# Patient Record
Sex: Female | Born: 1940 | Race: White | Hispanic: No | Marital: Married | Smoking: Former smoker
Health system: Southern US, Community
[De-identification: ages and names within clinical notes are randomized; demographics above are authoritative.]

## PROBLEM LIST (undated history)

## (undated) DIAGNOSIS — N281 Cyst of kidney, acquired: Secondary | ICD-10-CM

## (undated) DIAGNOSIS — M1990 Unspecified osteoarthritis, unspecified site: Secondary | ICD-10-CM

## (undated) DIAGNOSIS — K5790 Diverticulosis of intestine, part unspecified, without perforation or abscess without bleeding: Secondary | ICD-10-CM

## (undated) DIAGNOSIS — M8580 Other specified disorders of bone density and structure, unspecified site: Secondary | ICD-10-CM

## (undated) DIAGNOSIS — L719 Rosacea, unspecified: Secondary | ICD-10-CM

## (undated) DIAGNOSIS — I7143 Infrarenal abdominal aortic aneurysm, without rupture: Secondary | ICD-10-CM

## (undated) DIAGNOSIS — K226 Gastro-esophageal laceration-hemorrhage syndrome: Secondary | ICD-10-CM

## (undated) DIAGNOSIS — I10 Essential (primary) hypertension: Secondary | ICD-10-CM

## (undated) DIAGNOSIS — I714 Abdominal aortic aneurysm, without rupture: Secondary | ICD-10-CM

## (undated) DIAGNOSIS — D649 Anemia, unspecified: Secondary | ICD-10-CM

## (undated) DIAGNOSIS — K7460 Unspecified cirrhosis of liver: Secondary | ICD-10-CM

## (undated) DIAGNOSIS — I700 Atherosclerosis of aorta: Secondary | ICD-10-CM

## (undated) DIAGNOSIS — I2510 Atherosclerotic heart disease of native coronary artery without angina pectoris: Secondary | ICD-10-CM

## (undated) DIAGNOSIS — E785 Hyperlipidemia, unspecified: Secondary | ICD-10-CM

## (undated) DIAGNOSIS — I712 Thoracic aortic aneurysm, without rupture: Secondary | ICD-10-CM

## (undated) HISTORY — PX: ABDOMINAL HYSTERECTOMY: SHX81

## (undated) HISTORY — DX: Diverticulosis of intestine, part unspecified, without perforation or abscess without bleeding: K57.90

## (undated) HISTORY — DX: Abdominal aortic aneurysm, without rupture: I71.4

## (undated) HISTORY — DX: Atherosclerosis of aorta: I70.0

## (undated) HISTORY — DX: Gastro-esophageal laceration-hemorrhage syndrome: K22.6

## (undated) HISTORY — DX: Infrarenal abdominal aortic aneurysm, without rupture: I71.43

## (undated) HISTORY — DX: Atherosclerotic heart disease of native coronary artery without angina pectoris: I25.10

## (undated) HISTORY — DX: Rosacea, unspecified: L71.9

## (undated) HISTORY — DX: Unspecified osteoarthritis, unspecified site: M19.90

## (undated) HISTORY — DX: Cyst of kidney, acquired: N28.1

## (undated) HISTORY — DX: Hyperlipidemia, unspecified: E78.5

## (undated) HISTORY — PX: TONSILLECTOMY AND ADENOIDECTOMY: SUR1326

## (undated) HISTORY — DX: Anemia, unspecified: D64.9

## (undated) HISTORY — DX: Unspecified cirrhosis of liver: K74.60

## (undated) HISTORY — DX: Other specified disorders of bone density and structure, unspecified site: M85.80

## (undated) HISTORY — DX: Essential (primary) hypertension: I10

---
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
## 2018-10-30 DIAGNOSIS — I712 Thoracic aortic aneurysm, without rupture, unspecified: Secondary | ICD-10-CM

## 2018-10-30 HISTORY — DX: Thoracic aortic aneurysm, without rupture, unspecified: I71.20

## 2018-10-30 HISTORY — DX: Thoracic aortic aneurysm, without rupture: I71.2

## 2018-12-08 ENCOUNTER — Encounter: Payer: Medicare Other | Admitting: Vascular Surgery

## 2018-12-08 ENCOUNTER — Encounter: Payer: Self-pay | Admitting: Vascular Surgery

## 2019-01-12 ENCOUNTER — Ambulatory Visit (INDEPENDENT_AMBULATORY_CARE_PROVIDER_SITE_OTHER): Payer: Medicare Other | Admitting: Vascular Surgery

## 2019-01-12 ENCOUNTER — Encounter: Payer: Self-pay | Admitting: Vascular Surgery

## 2019-01-12 ENCOUNTER — Other Ambulatory Visit: Payer: Self-pay

## 2019-01-12 VITALS — BP 137/75 | HR 88 | Temp 97.4°F | Resp 24 | Ht 59.0 in | Wt 164.8 lb

## 2019-01-12 DIAGNOSIS — I714 Abdominal aortic aneurysm, without rupture, unspecified: Secondary | ICD-10-CM

## 2019-01-12 DIAGNOSIS — I712 Thoracic aortic aneurysm, without rupture, unspecified: Secondary | ICD-10-CM

## 2019-01-12 NOTE — Progress Notes (Signed)
Referring Physician: Charlott Rakes  Patient name: Kathleen Morton MRN: [id number] DOB: [birth year] Sex: female  REASON FOR CONSULT: Thoracic and abdominal aortic aneurysm  HPI: Kathleen Morton is a 78 y.o. 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  Eventual work-up showed an esophageal tear that caused her bleeding.  This has now healed.  She has no family history of abdominal aortic aneurysm.  She has no abdominal or back pain.  She has fairly significant pulmonary dysfunction and become short of breath while after climbing only a couple of stairs.  She did quit smoking about 10 years ago.  She gets short of breath with minimal exertion.  She is currently on aspirin and a statin.  She does not really describe claudication symptoms.  She has no rest pain in her feet.  She has never had a myocardial infarction or stroke.  Other medical problems include hyperlipidemia, Mallory-Weiss tear as mentioned above, cirrhosis, diabetes.  All of these are currently stable.  Past Medical History:  Diagnosis Date  . Anemia   . Aneurysm of infrarenal abdominal aorta (HCC)    Fusiform.  4.6cm  . Aortic atherosclerosis (HCC)   . Cirrhosis of liver (HCC)   . Coronary artery calcification   . Diverticulosis    Descending and Sigmoid  . DJD (degenerative joint disease)    Mild facet in lower lumbar spine.  . Hyperlipidemia   . Hypertension   . Mallory-Weiss tear   . Osteopenia   . Renal cyst    3.2 cm  . Rosacea   . Thoracic aortic aneurysm (HCC) 10/30/2018   Distal 3.7cm   Past Surgical History:  Procedure Laterality Date  . ABDOMINAL HYSTERECTOMY    . TONSILLECTOMY AND ADENOIDECTOMY      Family History  Problem Relation Age of Onset  . Hypertension Mother   . Hypertension Father   . Heart disease Father   . Cancer Brother     SOCIAL HISTORY: Social History   Socioeconomic History  . Marital status: Married   Spouse name: Not on file  . Number of children: Not on file  . Years of education: Not on file  . Highest education level: Not on file  Occupational History  . Not on file  Social Needs  . Financial resource strain: Not on file  . Food insecurity    Worry: Not on file    Inability: Not on file  . Transportation needs    Medical: Not on file    Non-medical: Not on file  Tobacco Use  . Smoking status: Former Games developer  . Smokeless tobacco: Never Used  Substance and Sexual Activity  . Alcohol use: Yes    Comment: MInimal  . Drug use: Never  . Sexual activity: Not on file  Lifestyle  . Physical activity    Days per week: Not on file    Minutes per session: Not on file  . Stress: Not on file  Relationships  . Social Musician on phone: Not on file    Gets together: Not on file    Attends religious service: Not on file    Active member of club or organization: Not on file    Attends meetings of clubs or organizations: Not on file    Relationship status: Not on file  . Intimate partner violence    Fear of current or ex partner: Not on file  Emotionally abused: Not on file    Physically abused: Not on file    Forced sexual activity: Not on file  Other Topics Concern  . Not on file  Social History Narrative  . Not on file    Allergies  Allergen Reactions  . Diphenhydramine   . Meperidine And Related Nausea And Vomiting  . Tetanus Toxoids     And Vaccines    Current Outpatient Medications  Medication Sig Dispense Refill  . aspirin 81 MG chewable tablet Chew by mouth daily.    . Cholecalciferol (D3 ADULT PO) Take by mouth.    . DOXYLAMINE SUCCINATE, SLEEP, PO Take by mouth.    . losartan (COZAAR) 25 MG tablet Take 25 mg by mouth every evening.    . meclizine (ANTIVERT) 25 MG tablet TAKE 1 TABLET EVERY 6 HOURS AS NEEDED FOR DIZZINESS; TAKE 1/2 TO 1 TABLET AS NEEDED FOR VERTIGO SYMPTOMS.    . metFORMIN (GLUCOPHAGE) 500 MG tablet     . Multiple Vitamin  (MULTIVITAMIN) tablet Take 1 tablet by mouth daily.    . Multiple Vitamins-Minerals (OCUVITE PO) Take by mouth.    . pioglitazone (ACTOS) 30 MG tablet Take 15 mg by mouth daily.    . pravastatin (PRAVACHOL) 20 MG tablet Take 20 mg by mouth once a week.     No current facility-administered medications for this visit.     ROS:   General:  No weight loss, Fever, chills  HEENT: No recent headaches, no nasal bleeding, no visual changes, no sore throat  Neurologic: No dizziness, blackouts, seizures. No recent symptoms of stroke or mini- stroke. No recent episodes of slurred speech, or temporary blindness.  Cardiac: No recent episodes of chest pain/pressure, no shortness of breath at rest.  No shortness of breath with exertion.  Denies history of atrial fibrillation or irregular heartbeat  Vascular: No history of rest pain in feet.  No history of claudication.  No history of non-healing ulcer, No history of DVT   Pulmonary: No home oxygen, no productive cough, no hemoptysis,  No asthma or wheezing  Musculoskeletal:  [ ]  Arthritis, [ ]  Low back pain,  [ ]  Joint pain  Hematologic:No history of hypercoagulable state.  No history of easy bleeding.  No history of anemia  Gastrointestinal: No hematochezia or melena,  No gastroesophageal reflux, no trouble swallowing  Urinary: [ ]  chronic Kidney disease, [ ]  on HD - [ ]  MWF or [ ]  TTHS, [ ]  Burning with urination, [ ]  Frequent urination, [ ]  Difficulty urinating;   Skin: No rashes  Psychological: No history of anxiety,  No history of depression   Physical Examination  Vitals:   01/12/19 1401  BP: 137/75  Pulse: 88  Resp: (!) 24  Temp: (!) 97.4 F (36.3 C)  SpO2: 94%  Weight: 164 lb 12.8 oz (74.8 kg)  Height: 4\' 11"  (1.499 m)    Body mass index is 33.29 kg/m.  General:  Alert and oriented, no acute distress HEENT: Normal Neck: No JVD Pulmonary: Clear to auscultation bilaterally Cardiac: Regular Rate and Rhythm  Abdomen:  Soft, non-tender, non-distended, no mass, obese Skin: No rash Extremity Pulses:  2+ radial, brachial, femoral, absent popliteal dorsalis pedis, posterior tibial pulses bilaterally Musculoskeletal: No deformity or edema  Neurologic: Upper and lower extremity motor 5/5 and symmetric  DATA:  I reviewed the images from the patient's CT scan of the chest abdomen and [facility] 30, [facility].  This shows a  3.7 cm descending thoracic aneurysm and a 4.6 cm infrarenal abdominal aortic aneurysm.  There is a normal intervening segment of aorta in the visceral renal segment.  The iliacs were not fully imaged due to the nature of the CT scan.  ASSESSMENT: #1 infrarenal 4.6 cm abdominal aortic aneurysm.  Discussed with the patient today pathophysiology of aneurysms and risk of rupture.  I discussed with her that we would consider repair at 5 to 5-1/2 cm in diameter.  2.  3.7 cm descending thoracic aneurysm.  I discussed with the patient that we would consider repair at 6 cm in diameter.   PLAN: #1 the patient will be scheduled for aortic ultrasound in 6 months time.  With her body habitus this may be difficult to do.  If things are not well visualized into the iliac segments in the abdominal aorta she will need a CT Angio of the abdomen and pelvis.  #2 as far as her thoracic aneurysm is concerned this is fairly small.  Rather than imaging her frequently I would suggest that we reimage her chest in about 2 to 3 years.  The patient will return to our [facility] clinic with the above tests in 6 months.   Ruta Hinds, MD [facility] [place] Office: [phone] Pager: [phone]

## 2019-03-25 DIAGNOSIS — K922 Gastrointestinal hemorrhage, unspecified: Secondary | ICD-10-CM | POA: Diagnosis not present

## 2019-03-25 DIAGNOSIS — E1169 Type 2 diabetes mellitus with other specified complication: Secondary | ICD-10-CM

## 2019-03-25 DIAGNOSIS — I10 Essential (primary) hypertension: Secondary | ICD-10-CM | POA: Diagnosis not present

## 2019-03-25 DIAGNOSIS — I714 Abdominal aortic aneurysm, without rupture: Secondary | ICD-10-CM

## 2019-03-26 DIAGNOSIS — I10 Essential (primary) hypertension: Secondary | ICD-10-CM | POA: Diagnosis not present

## 2019-03-26 DIAGNOSIS — K922 Gastrointestinal hemorrhage, unspecified: Secondary | ICD-10-CM | POA: Diagnosis not present

## 2019-03-26 DIAGNOSIS — I714 Abdominal aortic aneurysm, without rupture: Secondary | ICD-10-CM | POA: Diagnosis not present

## 2019-03-26 DIAGNOSIS — E1169 Type 2 diabetes mellitus with other specified complication: Secondary | ICD-10-CM | POA: Diagnosis not present

## 2019-08-28 ENCOUNTER — Other Ambulatory Visit: Payer: Self-pay

## 2019-08-28 DIAGNOSIS — I714 Abdominal aortic aneurysm, without rupture, unspecified: Secondary | ICD-10-CM

## 2019-09-18 ENCOUNTER — Other Ambulatory Visit: Payer: Self-pay

## 2019-09-18 ENCOUNTER — Ambulatory Visit
Admission: RE | Admit: 2019-09-18 | Discharge: 2019-09-18 | Disposition: A | Discharge status: Home / Self Care | Payer: Medicare Other | Source: Ambulatory Visit | Attending: Vascular Surgery | Admitting: Vascular Surgery

## 2019-09-18 DIAGNOSIS — I714 Abdominal aortic aneurysm, without rupture, unspecified: Secondary | ICD-10-CM

## 2019-09-18 MED ORDER — IOPAMIDOL (ISOVUE-370) INJECTION 76%
75.0000 mL | Freq: Once | INTRAVENOUS | Status: AC | PRN
  Administered 2019-09-18: 75 mL via INTRAVENOUS

## 2019-09-21 ENCOUNTER — Ambulatory Visit: Payer: Medicare Other | Admitting: Vascular Surgery

## 2019-10-26 ENCOUNTER — Ambulatory Visit (INDEPENDENT_AMBULATORY_CARE_PROVIDER_SITE_OTHER): Payer: Medicare Other | Admitting: Vascular Surgery

## 2019-10-26 ENCOUNTER — Encounter: Payer: Self-pay | Admitting: Vascular Surgery

## 2019-10-26 ENCOUNTER — Other Ambulatory Visit: Payer: Self-pay

## 2019-10-26 VITALS — BP 110/61 | HR 97 | Temp 98.0°F | Resp 20 | Wt 168.0 lb

## 2019-10-26 DIAGNOSIS — I714 Abdominal aortic aneurysm, without rupture, unspecified: Secondary | ICD-10-CM

## 2019-10-26 DIAGNOSIS — I739 Peripheral vascular disease, unspecified: Secondary | ICD-10-CM | POA: Diagnosis not present

## 2019-10-26 NOTE — Progress Notes (Signed)
Patient name: Kathleen Morton MRN: [id number] DOB: 1940-07-02 Sex: female  HPI: Kathleen Morton is a 79 y.o. female, with known abdominal aortic and thoracic aortic aneurysm.  She was last seen October 2020.  At that time she had a 4.6 cm infrarenal abdominal aortic aneurysm as well as a 3.7 cm descending thoracic aneurysm.  Other medical problems include vertigo.  He also has a history of cirrhosis hyperlipidemia hypertension all of which are currently stable.  Past Medical History:  Diagnosis Date  . Anemia   . Aneurysm of infrarenal abdominal aorta (HCC)    Fusiform.  4.6cm  . Aortic atherosclerosis (HCC)   . Cirrhosis of liver (HCC)   . Coronary artery calcification   . Diverticulosis    Descending and Sigmoid  . DJD (degenerative joint disease)    Mild facet in lower lumbar spine.  . Hyperlipidemia   . Hypertension   . Mallory-Weiss tear   . Osteopenia   . Renal cyst    3.2 cm  . Rosacea   . Thoracic aortic aneurysm (HCC) 10/30/2018   Distal 3.7cm   Past Surgical History:  Procedure Laterality Date  . ABDOMINAL HYSTERECTOMY    . TONSILLECTOMY AND ADENOIDECTOMY      Family History  Problem Relation Age of Onset  . Hypertension Mother   . Hypertension Father   . Heart disease Father   . Cancer Brother     SOCIAL HISTORY: Social History   Socioeconomic History  . Marital status: Married    Spouse name: Not on file  . Number of children: Not on file  . Years of education: Not on file  . Highest education level: Not on file  Occupational History  . Not on file  Tobacco Use  . Smoking status: Former Games developer  . Smokeless tobacco: Never Used  Vaping Use  . Vaping Use: Never used  Substance and Sexual Activity  . Alcohol use: Yes    Comment: MInimal  . Drug use: Never  . Sexual activity: Not on file  Other Topics Concern  . Not on file  Social History Narrative  . Not on file   Social Determinants of Health   Financial Resource Strain:   .  Difficulty of Paying Living Expenses:   Food Insecurity:   . Worried About Programme researcher, broadcasting/film/video in the Last Year:   . Barista in the Last Year:   Transportation Needs:   . Freight forwarder (Medical):   [name] Lack of Transportation (Non-Medical):   Physical Activity:   . Days of Exercise per Week:   . Minutes of Exercise per Session:   Stress:   . Feeling of Stress :   Social Connections:   . Frequency of Communication with Friends and Family:   . Frequency of Social Gatherings with Friends and Family:   . Attends Religious Services:   . Active Member of Clubs or Organizations:   . Attends Banker Meetings:   [name] Marital Status:   Intimate Partner Violence:   . Fear of Current or Ex-Partner:   . Emotionally Abused:   [name] Physically Abused:   . Sexually Abused:     Allergies  Allergen Reactions  . Meperidine And Related Nausea And Vomiting  . Diphenhydramine   . Tetanus Toxoids     And Vaccines    Current Outpatient Medications  Medication Sig Dispense Refill  . aspirin 81 MG chewable tablet Chew by mouth daily.    [name]  Cholecalciferol (D3 ADULT PO) Take by mouth.    . DOXYLAMINE SUCCINATE, SLEEP, PO Take by mouth.    . losartan (COZAAR) 25 MG tablet Take 25 mg by mouth every evening.    . meclizine (ANTIVERT) 25 MG tablet TAKE 1 TABLET EVERY 6 HOURS AS NEEDED FOR DIZZINESS; TAKE 1/2 TO 1 TABLET AS NEEDED FOR VERTIGO SYMPTOMS.    . metFORMIN (GLUCOPHAGE) 500 MG tablet     . Multiple Vitamin (MULTIVITAMIN) tablet Take 1 tablet by mouth daily.    . Multiple Vitamins-Minerals (OCUVITE PO) Take by mouth.    . pioglitazone (ACTOS) 30 MG tablet Take 15 mg by mouth daily.    . pravastatin (PRAVACHOL) 20 MG tablet Take 20 mg by mouth once a week.     No current facility-administered medications for this visit.    ROS:   She has no chest pain.  She develops shortness of breath after walking about a half a block.  Physical Examination  Vitals:   10/26/19  1359  BP: 110/61  Pulse: 97  Resp: 20  Temp: 98 F (36.7 C)  SpO2: 92%  Weight: 168 lb (76.2 kg)     General:  Alert and oriented, no acute distress HEENT: Normal Neck: No JVD Cardiac: Regular Rate and Rhythm Abdomen: Soft, non-tender, non-distended, no mass, obese  skin: No rash Extremity Pulses: Absent femoral popliteal pedal pulses bilaterally feet pink and warm Musculoskeletal: No deformity or edema  Neurologic: Upper and lower extremity motor 5/5 and symmetric  DATA:  She had a CT scan of the abdomen and pelvis on 09/18/2019.  I reviewed this images today.  This shows a 4.6 cm infrarenal abdominal aortic aneurysm.  There was also stenosis at the aortic bifurcation.  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  There was a high-grade stenosis of the right common iliac artery and left common iliac artery.  Of note there were diffuse nodular changes of the liver consistent with cirrhosis.   ASSESSMENT: Essentially unchanged 4.6 cm infrarenal abdominal aortic aneurysm.  There is severe neck angulation and occlusive disease which probably would preclude a aneurysm stent graft repair.  Patient is severely obese and with her cirrhosis underlying poor pulmonary function and body habitus she would be a very high risk operation probably with mortality exceeding 5%.  This is certainly higher than her risk of rupture of her aneurysm currently.   PLAN: Patient will follow up in 1 year with repeat ultrasound of the abdominal aorta.  Gust with the patient that we may not be able to see her aortic diameter due to her body habitus but if the ultrasound is unsuccessful we will schedule her for CT scan.  She was agreeable with this.  Consideration for repair if diameter reaches greater than 5-1/2 to 6 cm.  Although she has some narrowing of the distal abdominal aorta I believe any procedure in her is going to be high risk due to her multiple comorbidities.  She does have some buttock claudication at  about 50 feet in both sides.  However I do not believe the risk of operation is worth it for her mild to moderate symptoms.  Her thoracic aortic aneurysm was fairly small on last CT scan we will wait a few years before rescanning this.   Fabienne Bruns, MD [facility] [place] Office: [phone]

## 2020-12-19 ENCOUNTER — Other Ambulatory Visit: Payer: Self-pay

## 2020-12-19 DIAGNOSIS — I714 Abdominal aortic aneurysm, without rupture, unspecified: Secondary | ICD-10-CM

## 2020-12-22 NOTE — Progress Notes (Signed)
[facility] [place]  ASSESSMENT / PLAN: Kathleen Morton is a 80 y.o. female with an infrarenal abdominal aortic aneurysm measuring 81mm. She has cirrhosis with portal hypertension.  She has chronic obstructive pulmonary disease.  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  She is not a candidate for open reconstruction.  We should wait until she is past the threshold before considering prophylactic endovascular aneurysm repair. Angioplasty with lithotripsy may be a good option to try to create a channel for EVAR sheaths when she needs repair.  Complete cessation from all tobacco products. Excellent blood glucose control with goal A1c < 7%. Blood pressure control with goal blood pressure < 140/90 mmHg. Excellent lipid reduction therapy with goal LDL-C <100 mg/dL. Aspirin 81mg  PO QD.  Atorvastatin 40-80mg  PO QD (or other "high intensity" statin therapy)  Follow up with me in 6 months with AAA duplex  CHIEF COMPLAINT: AAA surveillance  HISTORY OF PRESENT ILLNESS: Kathleen Morton is a 80 y.o. female deviously seen by Dr. 77 for surveillance of infrarenal abdominal aortic aneurysm.  The patient is asymptomatic in regards to her aneurysm.  We had a lengthy discussion about the natural history of aneurysm disease and the rationale for treatment.  Explained her low risk of rupture.  Past Medical History:  Diagnosis Date   Anemia    Aneurysm of infrarenal abdominal aorta (HCC)    Fusiform.  4.6cm   Aortic atherosclerosis (HCC)    Cirrhosis of liver (HCC)    Coronary artery calcification    Diverticulosis    Descending and Sigmoid   DJD (degenerative joint disease)    Mild facet in lower lumbar spine.   Hyperlipidemia    Hypertension    Mallory-Weiss tear    Osteopenia    Renal cyst    3.2 cm   Rosacea    Thoracic aortic aneurysm (HCC) 10/30/2018   Distal 3.7cm    Past Surgical History:  Procedure Laterality  Date   ABDOMINAL HYSTERECTOMY     TONSILLECTOMY AND ADENOIDECTOMY      Family History  Problem Relation Age of Onset   Hypertension Mother    Hypertension Father    Heart disease Father    Cancer Brother     Social History   Socioeconomic History   Marital status: Married    Spouse name: Not on file   Number of children: Not on file   Years of education: Not on file   Highest education level: Not on file  Occupational History   Not on file  Tobacco Use   Smoking status: Former   Smokeless tobacco: Never  Vaping Use   Vaping Use: Never used  Substance and Sexual Activity   Alcohol use: Yes    Comment: MInimal   Drug use: Never   Sexual activity: Not on file  Other Topics Concern   Not on file  Social History Narrative   Not on file   Social Determinants of Health   Financial Resource Strain: Not on file  Food Insecurity: Not on file  Transportation Needs: Not on file  Physical Activity: Not on file  Stress: Not on file  Social Connections: Not on file  Intimate Partner Violence: Not on file    Allergies  Allergen Reactions   Meperidine And Related Nausea And Vomiting   Diphenhydramine    Tetanus Toxoids     And Vaccines    Current Outpatient Medications  Medication Sig Dispense Refill   aspirin  81 MG chewable tablet Chew by mouth daily.     Cholecalciferol (D3 ADULT PO) Take by mouth.     DOXYLAMINE SUCCINATE, SLEEP, PO Take by mouth.     losartan (COZAAR) 25 MG tablet Take 25 mg by mouth every evening.     meclizine (ANTIVERT) 25 MG tablet TAKE 1 TABLET EVERY 6 HOURS AS NEEDED FOR DIZZINESS; TAKE 1/2 TO 1 TABLET AS NEEDED FOR VERTIGO SYMPTOMS.     Multiple Vitamin (MULTIVITAMIN) tablet Take 1 tablet by mouth daily.     Multiple Vitamins-Minerals (OCUVITE PO) Take by mouth.     pioglitazone (ACTOS) 30 MG tablet Take 15 mg by mouth daily.     pravastatin (PRAVACHOL) 20 MG tablet Take 20 mg by mouth once a week.     metFORMIN (GLUCOPHAGE) 500 MG tablet   (Patient not taking: Reported on 12/24/2020)     No current facility-administered medications for this visit.    REVIEW OF SYSTEMS:  [X]  denotes positive finding, [ ]  denotes negative finding Cardiac  Comments:  Chest pain or chest pressure:    Shortness of breath upon exertion:    Short of breath when lying flat:    Irregular heart rhythm:        Vascular    Pain in calf, thigh, or hip brought on by ambulation:    Pain in feet at night that wakes you up from your sleep:     Blood clot in your veins:    Leg swelling:         Pulmonary    Oxygen at home:    Productive cough:     Wheezing:         Neurologic    Sudden weakness in arms or legs:     Sudden numbness in arms or legs:     Sudden onset of difficulty speaking or slurred speech:    Temporary loss of vision in one eye:     Problems with dizziness:         Gastrointestinal    Blood in stool:     Vomited blood:         Genitourinary    Burning when urinating:     Blood in urine:        Psychiatric    Major depression:         Hematologic    Bleeding problems:    Problems with blood clotting too easily:        Skin    Rashes or ulcers:        Constitutional    Fever or chills:      PHYSICAL EXAM Vitals:   12/24/20 0935  BP: 137/84  Pulse: 87  Resp: 20  Temp: 98.2 F (36.8 C)  SpO2: 93%  Weight: 167 lb (75.8 kg)  Height: 4\' 11"  (1.499 m)    Constitutional: well appearing. no distress. Appears well nourished.  Neurologic: CN intact. no focal findings. no sensory loss. Psychiatric:  Mood and affect symmetric and appropriate. Eyes:  No icterus. No conjunctival pallor. Ears, nose, throat:  mucous membranes moist. Midline trachea.  Cardiac: regular rate and rhythm.  Respiratory:  unlabored. Abdominal:  soft, non-tender, non-distended.  Peripheral vascular: no palpable pedal pulses. Warm feet.  Extremity: no edema. no cyanosis. no pallor.  Skin: no gangrene. no ulceration.  Lymphatic: no  Stemmer's sign. no palpable lymphadenopathy.  PERTINENT LABORATORY AND RADIOLOGIC DATA ABDOMINAL AORTA STUDY   Patient Name:  Kathleen Morton  Date of  Exam:   12/24/2020  Medical Rec #: [id number]           Accession #:    [id number]  Date of Birth: Mar 07, 1941           Patient Gender: F  Patient Age:   4 years  Exam Location:  [facility]  Procedure:      VAS [place] AAA DUPLEX  Referring Phys: Heath Lark    ---------------------------------------------------------------------------  -----     Indications: Follow up exam for known AAA.   Limitations: Air/bowel gas, obesity, patient discomfort and Patient was  not NPO.      Performing Technologist: Hardie Lora RVT      Examination Guidelines: A complete evaluation includes B-mode imaging,  spectral  Doppler, color Doppler, and power Doppler as needed of all accessible  portions  of each vessel. Bilateral testing is considered an integral part of a  complete  examination. Limited examinations for reoccurring indications may be  performed  as noted.      Abdominal Aorta Findings:  +-------------+-------+----------+----------+--------+--------+--------+  Location     AP (cm)Trans (cm)PSV (cm/s)WaveformThrombusComments  +-------------+-------+----------+----------+--------+--------+--------+  Proximal     2.43   2.51      72                                  +-------------+-------+----------+----------+--------+--------+--------+  Mid          4.60   4.78      166                                 +-------------+-------+----------+----------+--------+--------+--------+  RT EIA Distal0.6    0.6       92                                  +-------------+-------+----------+----------+--------+--------+--------+  LT EIA Distal0.6    0.6       145                                 +-------------+-------+----------+----------+--------+--------+--------+   Unable to visualize  distal AA secondary to bowel gas from patient not  being NPO.        Summary:  Abdominal Aorta: There is evidence of abnormal dilatation of the mid  Abdominal aorta. The largest aortic measurement is 4.8 cm. The largest  aortic diameter remains essentially unchanged compared to prior exam.  Previous diameter measurement was 4.6 cm  obtained on 09/18/2019 CT.     *See table(s) above for measurements and observations.     Electronically signed by Heath Lark on 12/24/2020 at 12:34:16 PM.    Rande Brunt. Lenell Antu, MD [facility] Phone Number: [phone] 12/24/2020 12:56 PM  Total time spent on preparing this encounter including chart review, data review, collecting history, examining the patient, coordinating care for this established patient, 40 minutes.  Portions of this report may have been transcribed using voice recognition software.  Every effort has been made to ensure accuracy; however, inadvertent computerized transcription errors may still be present.

## 2020-12-24 ENCOUNTER — Other Ambulatory Visit: Payer: Self-pay

## 2020-12-24 ENCOUNTER — Ambulatory Visit (HOSPITAL_COMMUNITY)
Admission: RE | Admit: 2020-12-24 | Discharge: 2020-12-24 | Disposition: A | Discharge status: Home / Self Care | Payer: Medicare Other | Source: Ambulatory Visit | Attending: Vascular Surgery | Admitting: Vascular Surgery

## 2020-12-24 ENCOUNTER — Ambulatory Visit (INDEPENDENT_AMBULATORY_CARE_PROVIDER_SITE_OTHER): Payer: Medicare Other | Admitting: Vascular Surgery

## 2020-12-24 ENCOUNTER — Encounter: Payer: Self-pay | Admitting: Vascular Surgery

## 2020-12-24 VITALS — BP 137/84 | HR 87 | Temp 98.2°F | Resp 20 | Ht 59.0 in | Wt 167.0 lb

## 2020-12-24 DIAGNOSIS — I714 Abdominal aortic aneurysm, without rupture, unspecified: Secondary | ICD-10-CM

## 2020-12-26 ENCOUNTER — Other Ambulatory Visit: Payer: Self-pay

## 2020-12-26 DIAGNOSIS — I714 Abdominal aortic aneurysm, without rupture, unspecified: Secondary | ICD-10-CM

## 2021-07-01 NOTE — Progress Notes (Signed)
?HISTORY AND PHYSICAL  ? ? ? ?CC:  follow up. ?Requesting Provider:  Marco Collie, MD ? ?HPI: This is a 81 y.o. female who is here today for follow up for AAA.  She has an infrarenal AAA.  She has hx of portal HTN and COPD.  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  She was deemed not a candidate for open reconstruction and felt waiting until she is past the threshold before considering prophylactic endovascular aneurysm repair.  It was felt that angioplasty with lithotripsy may be a good option to try to create a channel for EVAR sheaths when she needs repair.   ? ?Pt was last seen 12/24/2020 and at that time, she was asymptomatic.  ? ?The pt returns today for follow up.  She states she is doing well.  She has some back pain but this is not new.  She denies any severe abdominal or back pain.  She does not have claudication or rest pain or non healing wounds.  ? ?The pt is on a statin for cholesterol management.    ?The pt is on an aspirin.    Other AC:  none ?The pt is on ARB for hypertension.  ?The pt does have diabetes. ?Tobacco hx:  former ? ? ? ?Past Medical History:  ?Diagnosis Date  ? Anemia   ? Aneurysm of infrarenal abdominal aorta (HCC)   ? Fusiform.  4.6cm  ? Aortic atherosclerosis ([place])   ? Cirrhosis of liver ([place])   ? Coronary artery calcification   ? Diverticulosis   ? Descending and Sigmoid  ? DJD (degenerative joint disease)   ? Mild facet in lower lumbar spine.  ? Hyperlipidemia   ? Hypertension   ? Mallory-Weiss tear   ? Osteopenia   ? Renal cyst   ? 3.2 cm  ? Rosacea   ? Thoracic aortic aneurysm ([place]) 10/30/2018  ? Distal 3.7cm  ? ? ?Past Surgical History:  ?Procedure Laterality Date  ? ABDOMINAL HYSTERECTOMY    ? TONSILLECTOMY AND ADENOIDECTOMY    ? ? ?Allergies  ?Allergen Reactions  ? Meperidine And Related Nausea And Vomiting  ? Diphenhydramine   ? Tetanus Toxoids   ?  And Vaccines  ? ? ?Current Outpatient Medications  ?Medication Sig Dispense Refill  ? aspirin 81  MG chewable tablet Chew by mouth daily.    ? Cholecalciferol (D3 ADULT PO) Take by mouth.    ? DOXYLAMINE SUCCINATE, SLEEP, PO Take by mouth.    ? losartan (COZAAR) 25 MG tablet Take 25 mg by mouth every evening.    ? meclizine (ANTIVERT) 25 MG tablet TAKE 1 TABLET EVERY 6 HOURS AS NEEDED FOR DIZZINESS; TAKE 1/2 TO 1 TABLET AS NEEDED FOR VERTIGO SYMPTOMS.    ? metFORMIN (GLUCOPHAGE) 500 MG tablet  (Patient not taking: Reported on 12/24/2020)    ? Multiple Vitamin (MULTIVITAMIN) tablet Take 1 tablet by mouth daily.    ? Multiple Vitamins-Minerals (OCUVITE PO) Take by mouth.    ? pioglitazone (ACTOS) 30 MG tablet Take 15 mg by mouth daily.    ? pravastatin (PRAVACHOL) 20 MG tablet Take 20 mg by mouth once a week.    ? ?No current facility-administered medications for this visit.  ? ? ?Family History  ?Problem Relation Age of Onset  ? Hypertension Mother   ? Hypertension Father   ? Heart disease Father   ? Cancer Brother   ? ? ?Social History  ? ?Socioeconomic History  ?  Marital status: Married  ?  Spouse name: Not on file  ? Number of children: Not on file  ? Years of education: Not on file  ? Highest education level: Not on file  ?Occupational History  ? Not on file  ?Tobacco Use  ? Smoking status: Former  ? Smokeless tobacco: Never  ?Vaping Use  ? Vaping Use: Never used  ?Substance and Sexual Activity  ? Alcohol use: Yes  ?  Comment: MInimal  ? Drug use: Never  ? Sexual activity: Not on file  ?Other Topics Concern  ? Not on file  ?Social History Narrative  ? Not on file  ? ?Social Determinants of Health  ? ?Financial Resource Strain: Not on file  ?Food Insecurity: Not on file  ?Transportation Needs: Not on file  ?Physical Activity: Not on file  ?Stress: Not on file  ?Social Connections: Not on file  ?Intimate Partner Violence: Not on file  ? ? ? ?REVIEW OF SYSTEMS:  ? ?[X]  denotes positive finding, [ ]  denotes negative finding ?Cardiac  Comments:  ?Chest pain or chest pressure:    ?Shortness of breath upon exertion:     ?Short of breath when lying flat:    ?Irregular heart rhythm:    ?    ?Vascular    ?Pain in calf, thigh, or hip brought on by ambulation:    ?Pain in feet at night that wakes you up from your sleep:     ?Blood clot in your veins:    ?Leg swelling:     ?    ?Pulmonary    ?Oxygen at home:    ?Productive cough:     ?Wheezing:     ?    ?Neurologic    ?Sudden weakness in arms or legs:     ?Sudden numbness in arms or legs:     ?Sudden onset of difficulty speaking or slurred speech:    ?Temporary loss of vision in one eye:     ?Problems with dizziness:     ?    ?Gastrointestinal    ?Blood in stool:     ?Vomited blood:     ?    ?Genitourinary    ?Burning when urinating:     ?Blood in urine:    ?    ?Psychiatric    ?Major depression:     ?    ?Hematologic    ?Bleeding problems:    ?Problems with blood clotting too easily:    ?    ?Skin    ?Rashes or ulcers:    ?    ?Constitutional    ?Fever or chills:    ? ? ?PHYSICAL EXAMINATION: ? ?Today's Vitals  ? 07/04/21 0959  ?BP: (!) 167/84  ?Pulse: 98  ?Resp: 20  ?Temp: 98.1 ?F (36.7 ?C)  ?TempSrc: Temporal  ?SpO2: 96%  ?Weight: 163 lb 11.2 oz (74.3 kg)  ?Height: 4\' 11"  (1.499 m)  ?PainSc: 6   ?PainLoc: Back  ? ?Body mass index is 33.06 kg/m?. ? ? ?General:  WDWN in NAD; vital signs documented above ?Gait: Not observed ?HENT: WNL, normocephalic ?Pulmonary: normal non-labored breathing , without wheezing ?Cardiac: regular HR, without  Murmur; without carotid bruits ?Abdomen: soft, NT, no masses; aortic pulse is not palpable ?Skin: without rashes ?Vascular Exam/Pulses: ? Right Left  ?Radial 2+ (normal) 2+ (normal)  ?Popliteal Unable to palpate Unable to palpate  ?DP 1+ (weak) 1+ (weak)  ? ?Extremities: without ischemic changes, without Gangrene , without cellulitis; without open wounds;  ?  Musculoskeletal: no muscle wasting or atrophy  ?Neurologic: A&O X 3;  No focal weakness or paresthesias are detected ?Psychiatric:  The pt has Normal affect. ? ? ?Non-Invasive Vascular Imaging:    ?AAA Arterial duplex on 07/04/2021: ?Abdominal Aorta Findings:  ?+-------------+-------+----------+----------+----------+--------+----------  ?----+  ?Location     AP (cm)Trans (cm)PSV (cm/s)Waveform  ThrombusComments    ?+-------------+-------+----------+----------+----------+--------+----------  ?Proximal     2.67   2.72      72                                      ?+-------------+-------+----------+----------+----------+--------+----------  ?Mid          4.83   4.66      61                                      ?+-------------+-------+----------+----------+----------+--------+----------  ?Distal                                                    not visualized  ?+-------------+-------+----------+----------+----------+--------+----------  ?RT CIA Prox                                               not visualized  ?+-------------+-------+----------+----------+----------+--------+----------  ?RT CIA Mid                                                not visualized  ?+-------------+-------+----------+----------+----------+--------+----------  ?RT CIA Distal                 143       monophasic                   +-------------+-------+----------+----------+----------+--------+----------  ?RT EIA Prox                   109       monophasic                    ?+-------------+-------+----------+----------+----------+--------+----------  ?RT EIA Mid                    92        monophasic                    ?+-------------+-------+----------+----------+----------+--------+----------  ?LT CIA Prox                                               not visualized  ?+-------------+-------+----------+----------+----------+--------+----------  ?LT CIA Mid                                                not visualized  ?+-------------+-------+----------+----------+----------+--------+----------   ?  LT CIA Distal                                              not visualized  ?+-------------+-------+----------+----------+----------+--------+----------  ?LT EIA Mid                    149       biphasic                      ?+-------------+-------+----------+----------+----------+--------+----------  ? ?Summary:  ?Abdominal Aorta: There is

## 2021-07-04 ENCOUNTER — Encounter: Payer: Self-pay | Admitting: Physician Assistant

## 2021-07-04 ENCOUNTER — Ambulatory Visit (INDEPENDENT_AMBULATORY_CARE_PROVIDER_SITE_OTHER): Payer: Medicare Other | Admitting: Physician Assistant

## 2021-07-04 ENCOUNTER — Ambulatory Visit (HOSPITAL_COMMUNITY)
Admission: RE | Admit: 2021-07-04 | Discharge: 2021-07-04 | Disposition: A | Discharge status: Home / Self Care | Payer: Medicare Other | Source: Ambulatory Visit | Attending: Physician Assistant | Admitting: Physician Assistant

## 2021-07-04 ENCOUNTER — Other Ambulatory Visit: Payer: Self-pay

## 2021-07-04 VITALS — BP 167/84 | HR 98 | Temp 98.1°F | Resp 20 | Ht 59.0 in | Wt 163.7 lb

## 2021-07-04 DIAGNOSIS — I714 Abdominal aortic aneurysm, without rupture, unspecified: Secondary | ICD-10-CM | POA: Diagnosis present

## 2021-07-11 ENCOUNTER — Other Ambulatory Visit: Payer: Self-pay | Admitting: *Deleted

## 2021-07-11 DIAGNOSIS — R0989 Other specified symptoms and signs involving the circulatory and respiratory systems: Secondary | ICD-10-CM

## 2021-07-11 DIAGNOSIS — I739 Peripheral vascular disease, unspecified: Secondary | ICD-10-CM

## 2021-07-11 DIAGNOSIS — I714 Abdominal aortic aneurysm, without rupture, unspecified: Secondary | ICD-10-CM

## 2022-02-23 IMAGING — CT CT CTA ABD/PEL W/CM AND/OR W/O CM
1 of 4 series · 11 of 32 positions shown, 16 images · IV contrast (APPLIED)
Comparison: 03/25/2019

CLINICAL DATA: Follow-up abdominal aortic aneurysm.

EXAM:
CT ANGIOGRAPHY ABDOMEN AND PELVIS WITH CONTRAST AND WITHOUT CONTRAST
TECHNIQUE: Multidetector CT imaging of the abdomen and pelvis was performed
using the standard protocol during bolus administration of
intravenous contrast. Multiplanar reconstructed images and MIPs were
obtained and reviewed to evaluate the vascular anatomy.
CONTRAST:  75mL VLNUE6-TYO IOPAMIDOL (VLNUE6-TYO) INJECTION 76%

[Series 5: pre stent angio · axial · non-contrast · 0.93mm/px · z∈[-376,-3]mm · 11 of 423 slices shown, 16 images]
[im 25/423  soft-tissue]
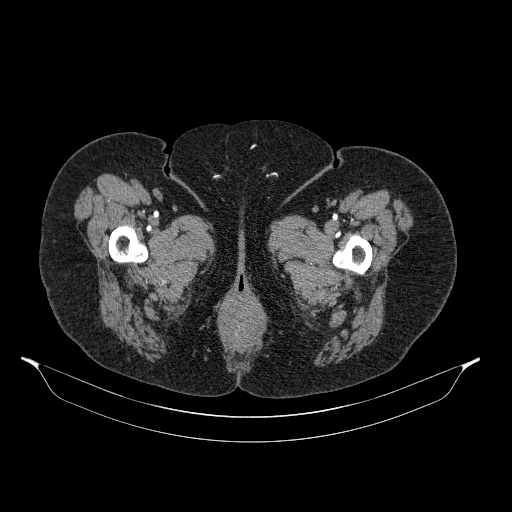
[im 25/423  bone]
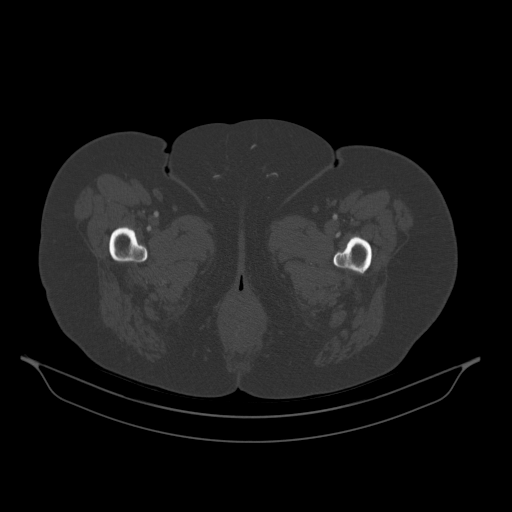
[im 75/423  soft-tissue]
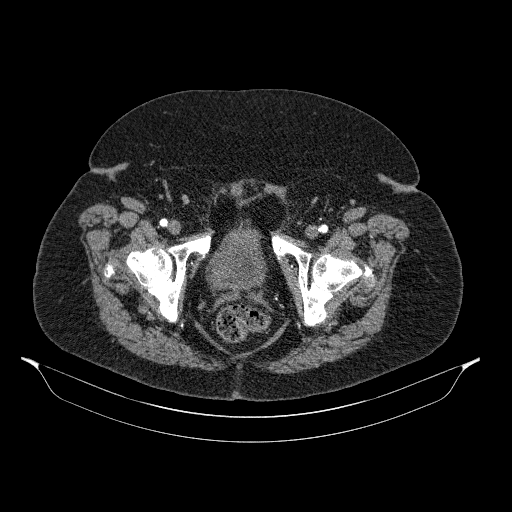
[im 125/423  soft-tissue]
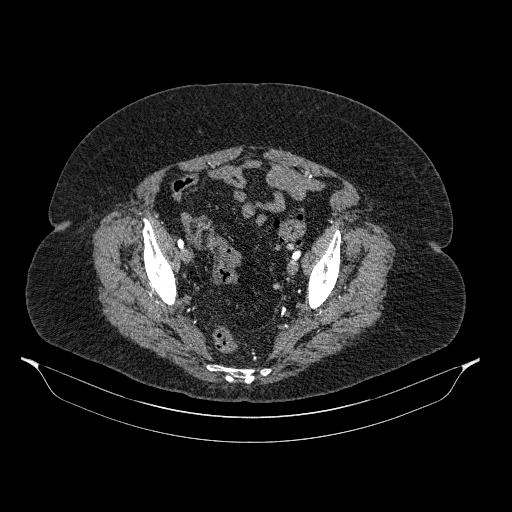
[im 149/423  soft-tissue]
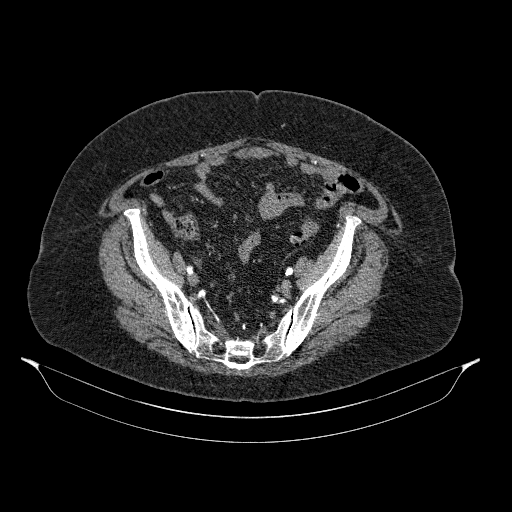
[im 199/423  soft-tissue]
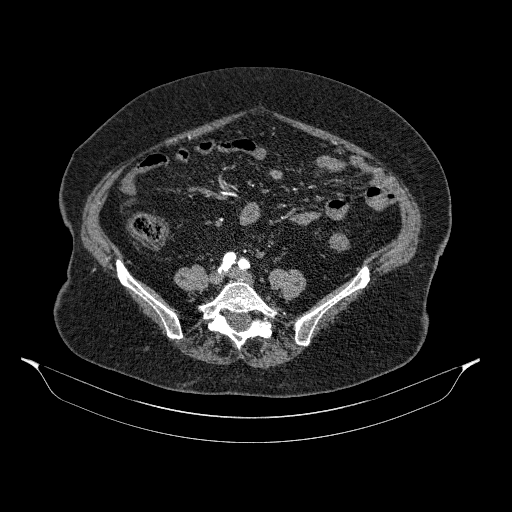
[im 224/423  soft-tissue]
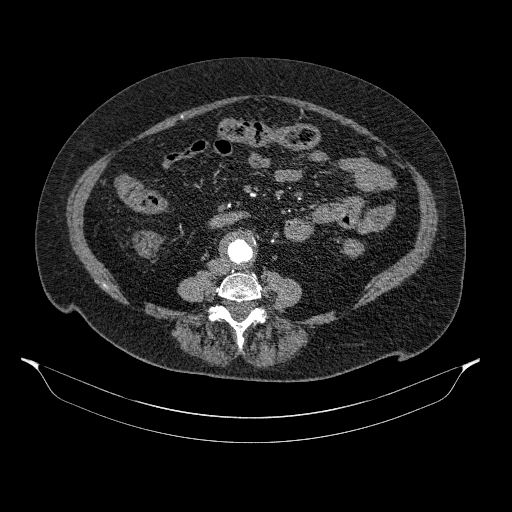
[im 274/423  soft-tissue]
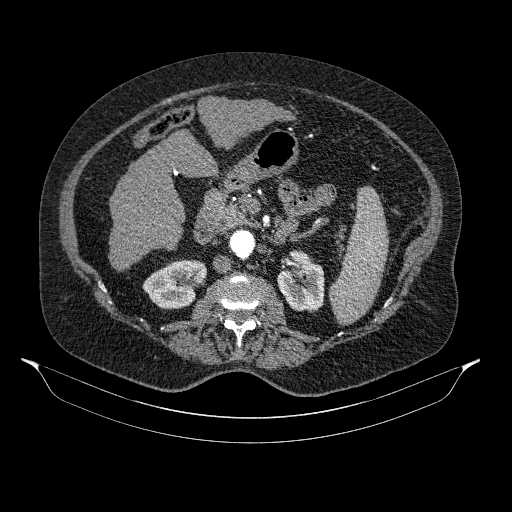
[im 323/423  soft-tissue]
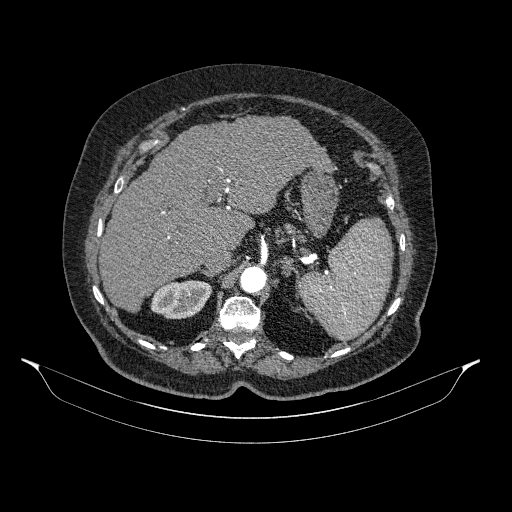
[im 323/423  lung]
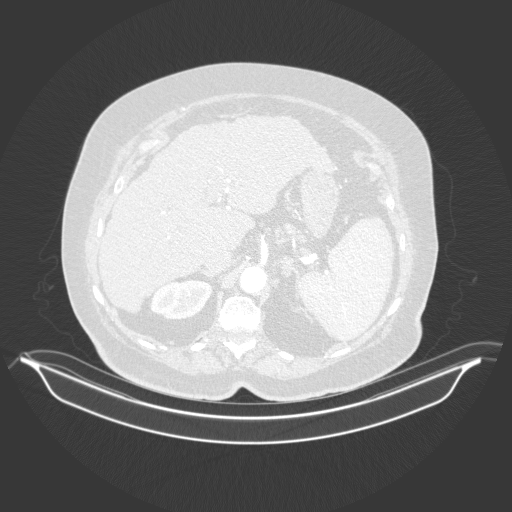
[im 348/423  soft-tissue]
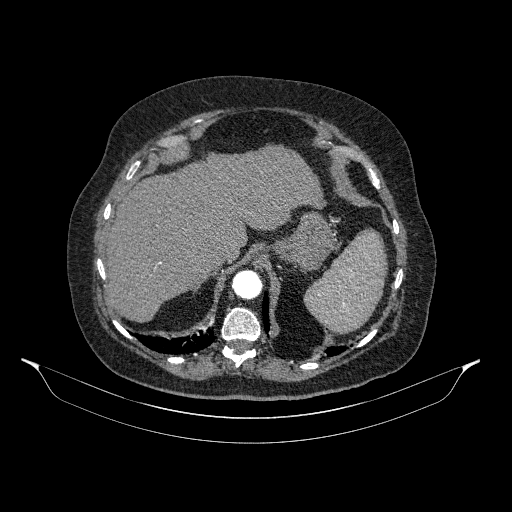
[im 348/423  lung]
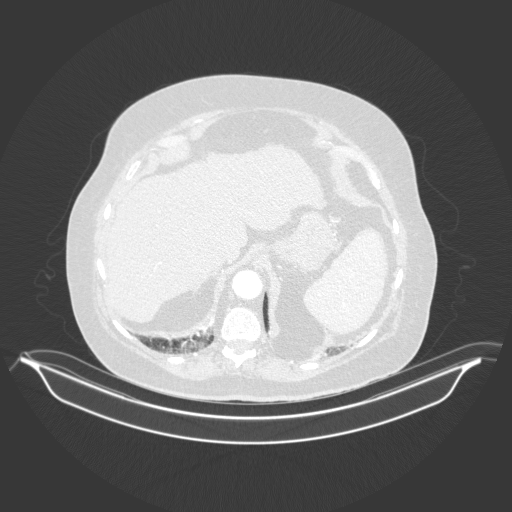
[im 348/423  bone]
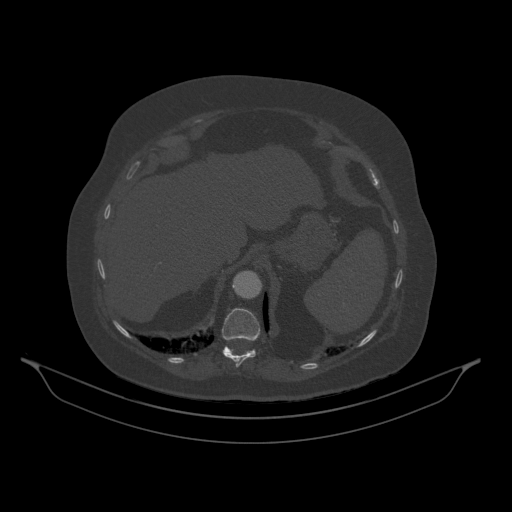
[im 373/423  lung]
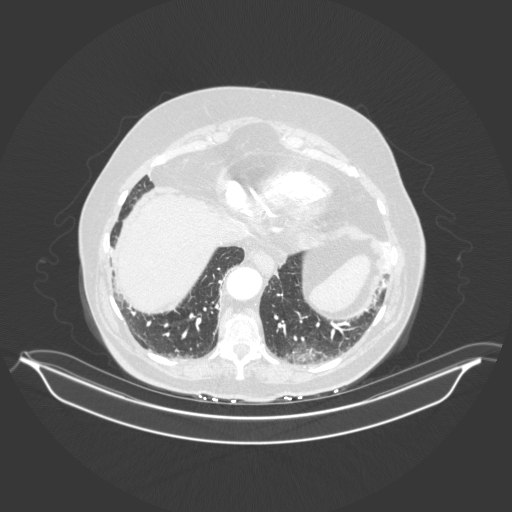
[im 398/423  soft-tissue]
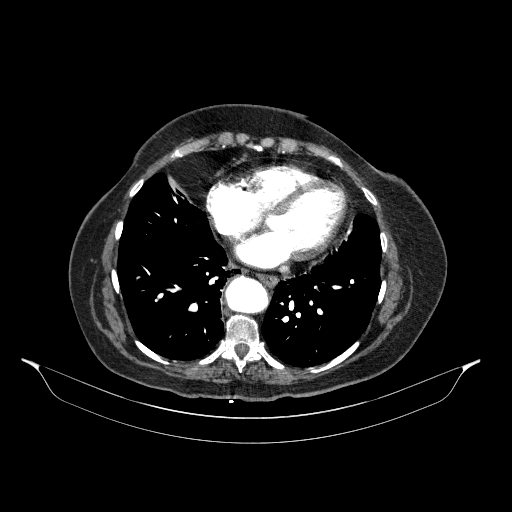
[im 398/423  lung]
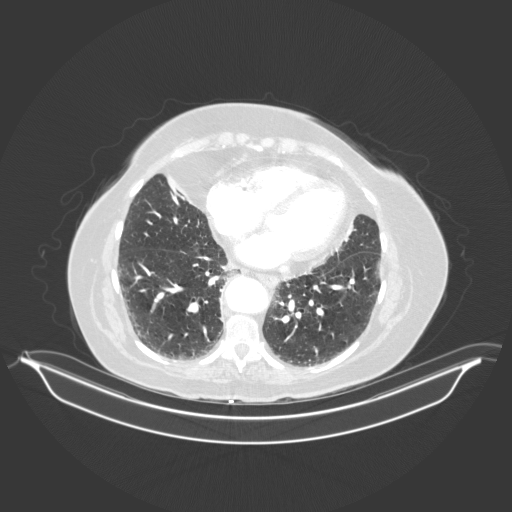

[11 of 32 positions shown; findings below may reference images not displayed]

FINDINGS: VASCULAR

Aorta: Distal descending thoracic aorta measures 3.3 cm and stable.
Infrarenal abdominal aorta is tortuous and makes an acute anterior
angulation. Infrarenal abdominal aorta is aneurysmal 4.6 x 4.3 cm on
sequence 4, image 89 and previously measured 4.6 x 4.3 cm. Again
noted is circumferential mural thrombus involving the abdominal
aortic aneurysm. Aneurysm terminates just above the aortic
bifurcation. Abdominal aorta is markedly narrowed just above the
aortic bifurcation with a luminal diameter of only 0.6 cm. Stenosis
at the aortic bifurcation is similar to the previous examination.

Celiac: Patent without evidence of aneurysm, dissection, vasculitis
or significant stenosis.

SMA: Patent without evidence of aneurysm, dissection, vasculitis or
significant stenosis.

Renals: Two right renal arteries that are widely patent. There are 2
left renal arteries. The main left renal artery has at least mild
stenosis from calcified plaque at the origin. Small superior
accessory left renal artery is patent.

IMA: Origin of the IMA is occluded from the mural thrombus within
the abdominal aortic aneurysm. Distal reconstitution of the IMA
branches.

Inflow: High-grade stenosis at the origin of the right common iliac
artery is similar to the previous examination. Second focus of
high-grade stenosis in the mid right common iliac artery. Distal
right common iliac artery is patent. Right internal iliac artery is
patent. Right external iliac artery is patent. Moderate to severe
stenosis at the origin of the left common iliac artery. Left
internal iliac artery is patent. Left external artery is patent.

Proximal Outflow: Proximal femoral arteries are patent bilaterally.

Veins: Portal venous system is patent. IVC and renal veins are
patent. Prominent venous structures in the left upper abdomen near
the hepatic flexure. Prominent venous structures in the anterior
abdomen near the left hepatic lobe.

Review of the MIP images confirms the above findings.

NON-VASCULAR

Lower chest: Peripheral densities at the lung bases probably
represent chronic changes and at least mild scarring. No large
pleural effusions.

Hepatobiliary: Diffuse nodularity of the liver is compatible with
cirrhosis. Gallbladder has been surgically removed. No suspicious
liver lesions.

Pancreas: Unremarkable. No pancreatic ductal dilatation or
surrounding inflammatory changes.

Spleen: Spleen is prominent for size measuring at 13.9 cm in the AP
dimension. Findings are similar to the previous examination. No
focal splenic abnormality.

Adrenals/Urinary Tract: Adrenal glands are within normal limits.
cm hypodensity in the right kidney is suggestive for a cyst.
Evidence for cortical scarring along the right kidney lower pole.
Probable additional small cyst in the right kidney lower pole
region. No hydronephrosis. No suspicious renal lesions. Urinary
bladder is decompressed.

Stomach/Bowel: Probable small appendix. Large colonic diverticula
involving the sigmoid colon. No evidence for bowel obstruction or
focal bowel inflammation.

Lymphatic: Prominent lymph node along the right side of the aorta in
the lower mediastinum measures 0.9 cm in the short axis on sequence
4, image 30 and this is stable. Mildly prominent lymph nodes in the
upper abdomen are similar to the previous examination. Index lymph
node in the precaval region measures 1.6 cm in the short axis on
sequence 4, 68 and minimally changed.

Reproductive: Status post hysterectomy. No adnexal masses.

Other: Trace ascites in the pelvis.

Musculoskeletal: Mild anterolisthesis at L4-L5. No acute bone
abnormality.
IMPRESSION: VASCULAR

1. Stable abdominal aortic aneurysm measuring up to 4.6 cm. Aortic
aneurysm NOS ([id number]).
2. Severe stenosis involving the aortic bifurcation and origin of
the common iliac arteries. Stenosis at the origin of the right
common iliac artery is more severe than the left common iliac
artery.
3. Severe stenosis in the mid right common iliac artery.
4. There are 2 renal arteries bilaterally.
5. Origin of the inferior mesenteric artery is occluded from the
abdominal aortic aneurysm mural thrombus.

NON-VASCULAR

1. Cirrhosis with splenomegaly, prominent venous collaterals and
trace ascites in the pelvis.
2. Colonic diverticulosis without acute colonic inflammation.
3. Prominent lymph nodes in the upper abdomen may be secondary to
cirrhosis. Findings are nonspecific.

## 2022-02-24 ENCOUNTER — Other Ambulatory Visit: Payer: Self-pay | Admitting: *Deleted

## 2022-02-24 DIAGNOSIS — R0989 Other specified symptoms and signs involving the circulatory and respiratory systems: Secondary | ICD-10-CM

## 2022-02-24 DIAGNOSIS — I714 Abdominal aortic aneurysm, without rupture, unspecified: Secondary | ICD-10-CM

## 2022-03-03 ENCOUNTER — Ambulatory Visit (HOSPITAL_COMMUNITY)
Admission: RE | Admit: 2022-03-03 | Discharge: 2022-03-03 | Disposition: A | Discharge status: Home / Self Care | Payer: Medicare Other | Source: Ambulatory Visit | Attending: Vascular Surgery | Admitting: Vascular Surgery

## 2022-03-03 ENCOUNTER — Ambulatory Visit (INDEPENDENT_AMBULATORY_CARE_PROVIDER_SITE_OTHER)
Admission: RE | Admit: 2022-03-03 | Discharge: 2022-03-03 | Disposition: A | Discharge status: Home / Self Care | Payer: Medicare Other | Source: Ambulatory Visit | Attending: Vascular Surgery | Admitting: Vascular Surgery

## 2022-03-03 ENCOUNTER — Ambulatory Visit (INDEPENDENT_AMBULATORY_CARE_PROVIDER_SITE_OTHER): Payer: Medicare Other | Admitting: Physician Assistant

## 2022-03-03 VITALS — BP 139/98 | HR 92 | Temp 98.2°F | Ht 59.0 in | Wt 166.0 lb

## 2022-03-03 DIAGNOSIS — R0989 Other specified symptoms and signs involving the circulatory and respiratory systems: Secondary | ICD-10-CM | POA: Insufficient documentation

## 2022-03-03 DIAGNOSIS — I714 Abdominal aortic aneurysm, without rupture, unspecified: Secondary | ICD-10-CM | POA: Insufficient documentation

## 2022-03-03 NOTE — Progress Notes (Signed)
[facility] HISTORY AND PHYSICAL   History of Present Illness:  Patient is a 81 y.o. year old female who presents for evaluation of abdominal aortic aneurysm   4.6cm at it's largest diameter.   She has no family history of abdominal aortic aneurysm.  She has no abdominal or back pain. She tolerates PO's without N/V.  On CTA there was also a 3.7 cm descending thoracic aneurysm.  This is considered fairly small and it will be imaged every 3-4 years.   She denise claudication, rest pain or non healing wounds.    She does have hip pain B.. it is not claudication symptoms it is more motor and hip maneuvers that cause the discomfort.    She has no history of stroke  or TIA and no new symptoms.  No amaurosis, aphasia or weakness.   She is medically managed ASA and Statin daily.      Past Medical History:  Diagnosis Date   Anemia    Aneurysm of infrarenal abdominal aorta (HCC)    Fusiform.  4.6cm   Aortic atherosclerosis (HCC)    Cirrhosis of liver (HCC)    Coronary artery calcification    Diverticulosis    Descending and Sigmoid   DJD (degenerative joint disease)    Mild facet in lower lumbar spine.   Hyperlipidemia    Hypertension    Mallory-Weiss tear    Osteopenia    Renal cyst    3.2 cm   Rosacea    Thoracic aortic aneurysm ([facility]CC) 10/30/2018   Distal 3.7cm    Past Surgical History:  Procedure Laterality Date   ABDOMINAL HYSTERECTOMY     TONSILLECTOMY AND ADENOIDECTOMY       Social History Social History   Tobacco Use   Smoking status: Former    Passive exposure: Never   Smokeless tobacco: Never  Vaping Use   Vaping Use: Never used  Substance Use Topics   Alcohol use: Yes    Comment: MInimal   Drug use: Never    Family History Family History  Problem Relation Age of Onset   Hypertension Mother    Hypertension Father    Heart disease Father    Cancer Brother     Allergies  Allergies  Allergen Reactions   Meperidine And  Related Nausea And Vomiting   Diphenhydramine    Tetanus Toxoids     And Vaccines     Current Outpatient Medications  Medication Sig Dispense Refill   aspirin 81 MG chewable tablet Chew by mouth daily.     Cholecalciferol (D3 ADULT PO) Take by mouth.     DOXYLAMINE SUCCINATE, SLEEP, PO Take by mouth.     losartan (COZAAR) 25 MG tablet Take 25 mg by mouth every evening.     meclizine (ANTIVERT) 25 MG tablet TAKE 1 TABLET EVERY 6 HOURS AS NEEDED FOR DIZZINESS; TAKE 1/2 TO 1 TABLET AS NEEDED FOR VERTIGO SYMPTOMS.     Multiple Vitamin (MULTIVITAMIN) tablet Take 1 tablet by mouth daily.     Multiple Vitamins-Minerals (OCUVITE PO) Take by mouth.     pioglitazone (ACTOS) 30 MG tablet Take 15 mg by mouth daily.     pravastatin (PRAVACHOL) 20 MG tablet Take 20 mg by mouth once a week.     No current facility-administered medications for this visit.    ROS:   General:  No weight loss, Fever, chills  HEENT: No recent headaches, no nasal bleeding, no visual changes, no sore throat  Neurologic: No dizziness, blackouts, seizures. No recent symptoms of stroke or mini- stroke. No recent episodes of slurred speech, or temporary blindness.  Cardiac: No recent episodes of chest pain/pressure, no shortness of breath at rest.  No shortness of breath with exertion.  Denies history of atrial fibrillation or irregular heartbeat  Vascular: No history of rest pain in feet.  No history of claudication.  No history of non-healing ulcer, No history of DVT   Pulmonary: No home oxygen, no productive cough, no hemoptysis,  No asthma or wheezing  Musculoskeletal:  [ x] Arthritis, [ ]  Low back pain,  [ ]  Joint pain  Hematologic:No history of hypercoagulable state.  No history of easy bleeding.  No history of anemia  Gastrointestinal: No hematochezia or melena,  No gastroesophageal reflux, no trouble swallowing  Urinary: [ ]  chronic Kidney disease, [ ]  on HD - [ ]  MWF or [ ]  TTHS, [ ]  Burning with urination,  [ ]  Frequent urination, [ ]  Difficulty urinating;   Skin: No rashes  Psychological: No history of anxiety,  No history of depression   Physical Examination  Vitals:   03/03/22 1052  BP: (!) 139/98  Pulse: 92  Temp: 98.2 F (36.8 C)  TempSrc: Temporal  SpO2: 93%  Weight: 166 lb (75.3 kg)  Height: 4\' 11"  (1.499 m)    Body mass index is 33.53 kg/m.  General:  Alert and oriented, no acute distress HEENT: Normal Neck: No bruit or JVD Pulmonary: Clear to auscultation bilaterally Cardiac: Regular Rate and Rhythm without murmur Gastrointestinal: Soft, non-tender, non-distended, no mass, no scars Skin: No rash Extremity Pulses:  palpable radial,  doppler B dorsalis pedis, posterior tibial  Musculoskeletal: No deformity or edema  Neurologic: Upper and lower extremity motor 5/5 and symmetric  DATA:  Right Carotid Findings:  +----------+--------+--------+--------+------------------+--------+           PSV cm/sEDV cm/sStenosisPlaque DescriptionComments  +----------+--------+--------+--------+------------------+--------+  CCA Prox  104     14                                          +----------+--------+--------+--------+------------------+--------+  CCA Mid   81      11                                          +----------+--------+--------+--------+------------------+--------+  CCA Distal69      13                                          +----------+--------+--------+--------+------------------+--------+  ICA Prox  66      10      1-39%                               +----------+--------+--------+--------+------------------+--------+  ICA Mid   58      12                                          +----------+--------+--------+--------+------------------+--------+  ICA Distal75      14  heterogenous                +----------+--------+--------+--------+------------------+--------+  ECA      168     20                                           +----------+--------+--------+--------+------------------+--------+   +----------+--------+-------+----------------+-------------------+           PSV cm/sEDV cmsDescribe        Arm Pressure (mmHG)  +----------+--------+-------+----------------+-------------------+  Subclavian151    2      Multiphasic, [id number]                  +----------+--------+-------+----------------+-------------------+   +---------+--------+--+--------+--+---------+  VertebralPSV cm/s87EDV cm/s13Antegrade  +---------+--------+--+--------+--+---------+      Left Carotid Findings:  +----------+--------+--------+--------+------------------+--------+           PSV cm/sEDV cm/sStenosisPlaque DescriptionComments  +----------+--------+--------+--------+------------------+--------+  CCA Prox  80      12                                          +----------+--------+--------+--------+------------------+--------+  CCA Mid   81      20                                          +----------+--------+--------+--------+------------------+--------+  CCA Distal70      12                                          +----------+--------+--------+--------+------------------+--------+  ICA Prox  66      16      1-39%   heterogenous                +----------+--------+--------+--------+------------------+--------+  ICA Mid   77      18                                          +----------+--------+--------+--------+------------------+--------+  ICA Distal85      22                                          +----------+--------+--------+--------+------------------+--------+  ECA      125     18                                          +----------+--------+--------+--------+------------------+--------+   +----------+--------+--------+----------------+-------------------+           PSV cm/sEDV cm/sDescribe        Arm Pressure (mmHG)   +----------+--------+--------+----------------+-------------------+  [id number]     1       Multiphasic, [id number]                  +----------+--------+--------+----------------+-------------------+   +---------+--------+--------+----------+  VertebralPSV cm/sEDV cm/sRetrograde  +---------+--------+--------+----------+   Right Brachial artery triphaslic 79 cm/s,  left Brachial artery dampened  biphasic 52 cm/s.      Summary:  Right Carotid: Velocities in the right ICA are consistent with a 1-39%  stenosis.   Left Carotid: Velocities in the left ICA are consistent with a 1-39%  stenosis.   Vertebrals: Right vertebral artery demonstrates antegrade flow. Left  vertebral              artery demonstrates retrograde flow.  Subclavians: Normal flow hemodynamics were seen in the right subclavian  artery.              Left subclavian dampened broad bipahasic waveform.     Abdominal Aorta Findings:  +-----------+-------+----------+----------+--------+--------+--------+  Location  AP (cm)Trans (cm)PSV (cm/s)WaveformThrombusComments  +-----------+-------+----------+----------+--------+--------+--------+  Proximal  3.20   3.00      40                                  +-----------+-------+----------+----------+--------+--------+--------+  Mid       4.70   4.50      169                                 +-----------+-------+----------+----------+--------+--------+--------+  Distal    3.90   -         77                        NWV       +-----------+-------+----------+----------+--------+--------+--------+  RT CIA Prox-                                          NV        +-----------+-------+----------+----------+--------+--------+--------+  LT CIA Prox-                                          NV        +-----------+-------+----------+----------+--------+--------+--------+    Summary:  Abdominal Aorta: There is evidence of  abnormal dilatation of the mid  Abdominal aorta. Previous diameter measurement was 4.8 x 4.6 cm. Unable to  visualize bilateral Iliac arteries.    ASSESSMENT:  Asymptomatic AAA The AAA size is unchanged from previous study and she remains asymptomatic.  She denies ischemic LE symptoms.  She has hip pain that is aggravated with internal and external hip rotation.  I would suggest Orthopedic visit and possible hip  xrays to get a diagnosis.    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  She does not have a bruit on auscultation today.  She is asymptomatic for stroke/TIA.    PLAN:Continue activity as tolerates without restrictions.  Exercise as tolerates and healthy diet.  She will f/u for repeat AAA duplex in 6 months.  If she has sever abdominal pain or lumbar pain she will call 911.    Mosetta PigeonEmma Maureen Kishaun Erekson PA-C [facility] Office: [phone]  MD in clinic [place]

## 2022-03-11 ENCOUNTER — Other Ambulatory Visit: Payer: Self-pay

## 2022-03-11 DIAGNOSIS — I739 Peripheral vascular disease, unspecified: Secondary | ICD-10-CM

## 2022-03-11 DIAGNOSIS — I714 Abdominal aortic aneurysm, without rupture, unspecified: Secondary | ICD-10-CM

## 2022-09-25 ENCOUNTER — Other Ambulatory Visit: Payer: Self-pay | Admitting: *Deleted

## 2022-09-25 DIAGNOSIS — I714 Abdominal aortic aneurysm, without rupture, unspecified: Secondary | ICD-10-CM

## 2022-10-05 NOTE — Progress Notes (Unsigned)
HISTORY AND PHYSICAL     CC:  follow up. Requesting Provider:  Abner Greenspan, MD  HPI: This is a 82 y.o. female who is here today for follow up for AAA.   She has an infrarenal AAA.  She has hx of portal HTN and COPD.  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  She was deemed not a candidate for open reconstruction and felt waiting until she is past the threshold before considering prophylactic endovascular aneurysm repair.  It was felt that angioplasty with lithotripsy may be a good option to try to create a channel for EVAR sheaths when she needs repair.   Pt was last seen 03/03/2022 and at that time, she was doing well without back or abdominal pain.  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  It was considered fairly small and could be imaged every 3-4 years.  She was not having claudication or rest pain or non healing wounds.  She did have bilateral hip pain.  She did not have any neurological sx. She had a carotid duplex that revealed 1-39% bilateral ICA stenosis. She was scheduled for 6 month follow up.  The pt returns today for follow up.  She denies any new abdominal or back pain.  She denies any claudication or rest pain or non healing wounds.  She states that she was taken off of her asa due to stomach ulcers.  She is compliant with her statin.   The pt is on a statin for cholesterol management.    The pt is on an aspirin.    Other AC:  is The pt is on ARB for hypertension.  The pt is is on medication diabetes. Tobacco hx:  former  Pt does not have family hx of AAA.  Past Medical History:  Diagnosis Date   Anemia    Aneurysm of infrarenal abdominal aorta (HCC)    Fusiform.  4.6cm   Aortic atherosclerosis (HCC)    Cirrhosis of liver (HCC)    Coronary artery calcification    Diverticulosis    Descending and Sigmoid   DJD (degenerative joint disease)    Mild facet in lower lumbar spine.   Hyperlipidemia    Hypertension     Mallory-Weiss tear    Osteopenia    Renal cyst    3.2 cm   Rosacea    Thoracic aortic aneurysm (HCC) 10/30/2018   Distal 3.7cm    Past Surgical History:  Procedure Laterality Date   ABDOMINAL HYSTERECTOMY     TONSILLECTOMY AND ADENOIDECTOMY      Allergies  Allergen Reactions   Meperidine And Related Nausea And Vomiting   Diphenhydramine    Tetanus Toxoids     And Vaccines    Current Outpatient Medications  Medication Sig Dispense Refill   aspirin 81 MG chewable tablet Chew by mouth daily.     Cholecalciferol (D3 ADULT PO) Take by mouth.     DOXYLAMINE SUCCINATE, SLEEP, PO Take by mouth.     losartan (COZAAR) 25 MG tablet Take 25 mg by mouth every evening.     meclizine (ANTIVERT) 25 MG tablet TAKE 1 TABLET EVERY 6 HOURS AS NEEDED FOR DIZZINESS; TAKE 1/2 TO 1 TABLET AS NEEDED FOR VERTIGO SYMPTOMS.     Multiple Vitamin (MULTIVITAMIN) tablet Take 1 tablet by mouth daily.     Multiple Vitamins-Minerals (OCUVITE PO) Take by mouth.     pioglitazone (ACTOS) 30 MG tablet Take 15 mg  by mouth daily.     pravastatin (PRAVACHOL) 20 MG tablet Take 20 mg by mouth once a week.     No current facility-administered medications for this visit.    Family History  Problem Relation Age of Onset   Hypertension Mother    Hypertension Father    Heart disease Father    Cancer Brother     Social History   Socioeconomic History   Marital status: Married    Spouse name: Not on file   Number of children: Not on file   Years of education: Not on file   Highest education level: Not on file  Occupational History   Not on file  Tobacco Use   Smoking status: Former    Passive exposure: Never   Smokeless tobacco: Never  Vaping Use   Vaping Use: Never used  Substance and Sexual Activity   Alcohol use: Yes    Comment: MInimal   Drug use: Never   Sexual activity: Not on file  Other Topics Concern   Not on file  Social History Narrative   Not on file   Social Determinants of  Health   Financial Resource Strain: Not on file  Food Insecurity: Not on file  Transportation Needs: Not on file  Physical Activity: Not on file  Stress: Not on file  Social Connections: Not on file  Intimate Partner Violence: Not on file     REVIEW OF SYSTEMS:   [X]  denotes positive finding, [ ]  denotes negative finding Cardiac  Comments:  Chest pain or chest pressure:    Shortness of breath upon exertion:    Short of breath when lying flat:    Irregular heart rhythm:        Vascular    Pain in calf, thigh, or hip brought on by ambulation:    Pain in feet at night that wakes you up from your sleep:     Blood clot in your veins:    Leg swelling:         Pulmonary    Oxygen at home:    Productive cough:     Wheezing:         Neurologic    Sudden weakness in arms or legs:     Sudden numbness in arms or legs:     Sudden onset of difficulty speaking or slurred speech:    Temporary loss of vision in one eye:     Problems with dizziness:         Gastrointestinal    Blood in stool:     Vomited blood:         Genitourinary    Burning when urinating:     Blood in urine:        Psychiatric    Major depression:         Hematologic    Bleeding problems:    Problems with blood clotting too easily:        Skin    Rashes or ulcers:        Constitutional    Fever or chills:      PHYSICAL EXAMINATION:  Today's Vitals   10/06/22 1111  BP: (!) 140/78  Pulse: 86  Resp: 18  Temp: 98.2 F (36.8 C)  TempSrc: Temporal  SpO2: 91%  Weight: 166 lb (75.3 kg)  Height: 4\' 11"  (1.499 m)  PainSc: 0-No pain   Body mass index is 33.53 kg/m.   General:  WDWN in NAD; vital signs documented  above Gait: Not observed HENT: WNL, normocephalic Pulmonary: normal non-labored breathing  Cardiac: regular HR, without carotid bruits Abdomen: soft, NT; aortic pulse is not palpable Skin: without rashes Vascular Exam/Pulses: Bilateral palpable radial pulses Monophasic right  PT>DP/pero and biphasic left DP/PT; + superficial veins present bilateral feet Extremities: without ischemic changes, without Gangrene , without cellulitis; without open wounds Musculoskeletal: no muscle wasting or atrophy  Neurologic: A&O X 3;  No focal weakness or paresthesias are detected Psychiatric:  The pt has Normal affect.   Non-Invasive Vascular Imaging:   AAA Arterial duplex on 10/06/2022: Abdominal Aorta Findings:  +-----------+-------+----------+----------+--------+--------+--------+  Location  AP (cm)Trans (cm)PSV (cm/s)WaveformThrombusComments  +-----------+-------+----------+----------+--------+--------+--------+  Proximal  3.26   3.64      48                                  +-----------+-------+----------+----------+--------+--------+--------+  Mid       4.90   4.90      42                                  +-----------+-------+----------+----------+--------+--------+--------+  Distal    4.24   3.88      51                                  +-----------+-------+----------+----------+--------+--------+--------+  RT CIA Prox                                           NV        +-----------+-------+----------+----------+--------+--------+--------+  LT CIA Prox                                           NV        +-----------+-------+----------+----------+--------+--------+--------+   Previous AAA arterial duplex on 03/03/2022: Maximum diameter of 4.7cm   ASSESSMENT/PLAN:: 82 y.o. female here for follow up for AAA  -pt without new abdominal or back pain.  She does not have claudication, rest pain or non healing wounds.  She has monophasic doppler flow right foot and biphasic flow left foot.  Unable to palpate femoral pulses due to body habitus as she cannot lay flat.  -previous diameter of AAA measured 4.7 cm in November 2023.  Maximum diameter today is 4.9cm.  only sight increase from original duplex in 2022 at which time, AAA  measured 4.8cm.  Will continue to monitor and repeat duplex in 6 months.  Discuss repair if AAA reaches 5.5cm.   -discussed if she develops sudden, severe, abdominal pain, she should call 911.   -continue statin-she is off asa due to stomach ulcers.  Discussed if she can tolerate asa, she would take it, but she will not take if she cannot tolerate this.  -pt will f/u in 6 months with AAA duplex.  Will also get ABI's to get baseline as well as BLE arterial duplex to evaluate popliteal arteries.  She will call sooner if there are any issues before then.     Doreatha Massed, [facility] [phone]  Clinic MD:   Conni Slipper

## 2022-10-06 ENCOUNTER — Ambulatory Visit (HOSPITAL_COMMUNITY)
Admission: RE | Admit: 2022-10-06 | Discharge: 2022-10-06 | Disposition: A | Discharge status: Home / Self Care | Payer: Medicare Other | Source: Ambulatory Visit | Attending: Vascular Surgery | Admitting: Vascular Surgery

## 2022-10-06 ENCOUNTER — Ambulatory Visit (INDEPENDENT_AMBULATORY_CARE_PROVIDER_SITE_OTHER): Payer: Medicare Other | Admitting: Physician Assistant

## 2022-10-06 VITALS — BP 140/78 | HR 86 | Temp 98.2°F | Resp 18 | Ht 59.0 in | Wt 166.0 lb

## 2022-10-06 DIAGNOSIS — I714 Abdominal aortic aneurysm, without rupture, unspecified: Secondary | ICD-10-CM | POA: Insufficient documentation

## 2022-10-06 DIAGNOSIS — I7141 Pararenal abdominal aortic aneurysm, without rupture: Secondary | ICD-10-CM

## 2022-10-13 ENCOUNTER — Other Ambulatory Visit: Payer: Self-pay

## 2022-10-13 DIAGNOSIS — I739 Peripheral vascular disease, unspecified: Secondary | ICD-10-CM

## 2022-10-13 DIAGNOSIS — I7141 Pararenal abdominal aortic aneurysm, without rupture: Secondary | ICD-10-CM

## 2023-04-20 ENCOUNTER — Encounter (HOSPITAL_COMMUNITY): Payer: BLUE CROSS/BLUE SHIELD

## 2023-04-20 ENCOUNTER — Ambulatory Visit: Payer: BLUE CROSS/BLUE SHIELD

## 2023-04-20 ENCOUNTER — Other Ambulatory Visit (HOSPITAL_COMMUNITY): Payer: BLUE CROSS/BLUE SHIELD

## 2023-05-18 ENCOUNTER — Ambulatory Visit (INDEPENDENT_AMBULATORY_CARE_PROVIDER_SITE_OTHER): Payer: Medicare Other | Admitting: Physician Assistant

## 2023-05-18 ENCOUNTER — Ambulatory Visit (INDEPENDENT_AMBULATORY_CARE_PROVIDER_SITE_OTHER)
Admission: RE | Admit: 2023-05-18 | Discharge: 2023-05-18 | Disposition: A | Discharge status: Home / Self Care | Payer: Medicare Other | Source: Ambulatory Visit | Attending: Vascular Surgery

## 2023-05-18 ENCOUNTER — Ambulatory Visit (HOSPITAL_COMMUNITY)
Admission: RE | Admit: 2023-05-18 | Discharge: 2023-05-18 | Disposition: A | Discharge status: Home / Self Care | Payer: Medicare Other | Source: Ambulatory Visit | Attending: Vascular Surgery | Admitting: Vascular Surgery

## 2023-05-18 ENCOUNTER — Encounter: Payer: Self-pay | Admitting: Physician Assistant

## 2023-05-18 VITALS — BP 126/82 | HR 88 | Wt 165.6 lb

## 2023-05-18 DIAGNOSIS — I7141 Pararenal abdominal aortic aneurysm, without rupture: Secondary | ICD-10-CM | POA: Diagnosis present

## 2023-05-18 DIAGNOSIS — I739 Peripheral vascular disease, unspecified: Secondary | ICD-10-CM | POA: Insufficient documentation

## 2023-05-18 DIAGNOSIS — I714 Abdominal aortic aneurysm, without rupture, unspecified: Secondary | ICD-10-CM | POA: Diagnosis not present

## 2023-05-18 LAB — VAS US ABI WITH/WO TBI
Left ABI: 0.76
Right ABI: 0.6

## 2023-05-18 NOTE — Progress Notes (Signed)
 Office Note     CC:  follow up Requesting Provider:  Trinidad Hun, MD  HPI: Kathleen Morton is a 83 y.o. (1940/05/04) female who presents for follow up of AAA. She has history of infrarenal AAA that we have been following. 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   The pt returns today for follow up with her husband. She denies any new abdominal or back pain. She does report pain in her hips that is  just a pain, like a grabbing pain. She says she can walk about 50 feet before this occurs and then she has to stop and rest. This is mostly relieved with rest after a few moments. It will occur again on continued ambulation. 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 She does not have any rest pain or non healing wounds. She is medically managed on statin and Aspirin.  The pt is on a statin for cholesterol management.    The pt is not on an aspirin due to ulcers Other AC:  The pt is on ARB for hypertension.  The pt is is on medication diabetes. Tobacco hx:  former  Past Medical History:  Diagnosis Date   Anemia    Aneurysm of infrarenal abdominal aorta (HCC)    Fusiform.  4.6cm   Aortic atherosclerosis (HCC)    Cirrhosis of liver (HCC)    Coronary artery calcification    Diverticulosis    Descending and Sigmoid   DJD (degenerative joint disease)    Mild facet in lower lumbar spine.   Hyperlipidemia    Hypertension    Mallory-Weiss tear    Osteopenia    Renal cyst    3.2 cm   Rosacea    Thoracic aortic aneurysm (HCC) 10/30/2018   Distal 3.7cm    Past Surgical History:  Procedure Laterality Date   ABDOMINAL HYSTERECTOMY     TONSILLECTOMY AND ADENOIDECTOMY      Social History   Socioeconomic History   Marital status: Married    Spouse name: Not on file   Number of children: Not on file   Years of education: Not on file   Highest education level: Not on file   Occupational History   Not on file  Tobacco Use   Smoking status: Former    Passive exposure: Never   Smokeless tobacco: Never  Vaping Use   Vaping status: Never Used  Substance and Sexual Activity   Alcohol use: Yes    Comment: MInimal   Drug use: Never   Sexual activity: Not on file  Other Topics Concern   Not on file  Social History Narrative   Not on file   Social Drivers of Health   Financial Resource Strain: Not on file  Food Insecurity: Not on file  Transportation Needs: Not on file  Physical Activity: Not on file  Stress: Not on file  Social Connections: Not on file  Intimate Partner Violence: Not on file    Family History  Problem Relation Age of Onset   Hypertension Mother    Hypertension Father    Heart disease Father    Cancer Brother     Current Outpatient Medications  Medication Sig Dispense Refill   Cholecalciferol (D3 ADULT PO) Take by mouth.     DOXYLAMINE SUCCINATE, SLEEP, PO Take by mouth.     losartan (COZAAR) 25 MG tablet Take 25 mg by mouth every  evening.     meclizine (ANTIVERT) 25 MG tablet TAKE 1 TABLET EVERY 6 HOURS AS NEEDED FOR DIZZINESS; TAKE 1/2 TO 1 TABLET AS NEEDED FOR VERTIGO SYMPTOMS.     Multiple Vitamin (MULTIVITAMIN) tablet Take 1 tablet by mouth daily.     Multiple Vitamins-Minerals (OCUVITE PO) Take by mouth.     pioglitazone (ACTOS) 30 MG tablet Take 15 mg by mouth daily.     pravastatin (PRAVACHOL) 20 MG tablet Take 20 mg by mouth once a week.     aspirin 81 MG chewable tablet Chew by mouth daily. (Patient not taking: Reported on 05/18/2023)     No current facility-administered medications for this visit.    Allergies  Allergen Reactions   Meperidine And Related Nausea And Vomiting   Diphenhydramine    Tetanus Toxoids     And Vaccines     REVIEW OF SYSTEMS:  [X]  denotes positive finding, [ ]  denotes negative finding Cardiac  Comments:  Chest pain or chest pressure:    Shortness of breath upon exertion:    Short  of breath when lying flat:    Irregular heart rhythm:        Vascular    Pain in calf, thigh, or hip brought on by ambulation:    Pain in feet at night that wakes you up from your sleep:     Blood clot in your veins:    Leg swelling:         Pulmonary    Oxygen at home:    Productive cough:     Wheezing:         Neurologic    Sudden weakness in arms or legs:     Sudden numbness in arms or legs:     Sudden onset of difficulty speaking or slurred speech:    Temporary loss of vision in one eye:     Problems with dizziness:         Gastrointestinal    Blood in stool:     Vomited blood:         Genitourinary    Burning when urinating:     Blood in urine:        Psychiatric    Major depression:         Hematologic    Bleeding problems:    Problems with blood clotting too easily:        Skin    Rashes or ulcers:        Constitutional    Fever or chills:      PHYSICAL EXAMINATION:  Vitals:   05/18/23 1111  BP: 126/82  Pulse: 88  SpO2: 93%  Weight: 165 lb 9.6 oz (75.1 kg)    General:  WDWN in NAD; vital signs documented above Gait: Normal HENT: WNL, normocephalic Pulmonary: normal non-labored breathing , without wheezing Cardiac: regular HR Abdomen: obese, soft, non tender, no palpable AAA Vascular Exam/Pulses: unable to palpate patients femoral pulses because of body habitus and inability to lay flat due to vertigo Extremities: without ischemic changes, without Gangrene , without cellulitis; without open wounds; Doppler DP/PT signals bilaterally Musculoskeletal: no muscle wasting or atrophy  Neurologic: A&O X 3 Psychiatric:  The pt has Normal affect.   Non-Invasive Vascular Imaging:   Abdominal Aorta Findings:  +-----------+-------+----------+----------+--------+--------+--------+  Location  AP (cm)Trans (cm)PSV (cm/s)WaveformThrombusComments  +-----------+-------+----------+----------+--------+--------+--------+  Proximal  3.38   3.64       53                                  +-----------+-------+----------+----------+--------+--------+--------+  Mid       4.99   4.90      53                                  +-----------+-------+----------+----------+--------+--------+--------+  Distal                                               NWV       +-----------+-------+----------+----------+--------+--------+--------+  RT CIA Prox                                           NWV       +-----------+-------+----------+----------+--------+--------+--------+  LT CIA Prox                                           NWV       +-----------+-------+----------+----------+--------+--------+--------+   Visualization of the Distal Abdominal Aorta, Right CIA Proximal artery and Left CIA Proximal artery was limited.   Summary:  Abdominal Aorta: The largest aortic measurement is 5.0 cm. The largest aortic diameter has increased compared to prior exam. Previous diameter measurement was 4.9 cm obtained on 10/06/22.   +-------+-----------+-----------+------------+------------+  ABI/TBIToday's ABIToday's TBIPrevious ABIPrevious TBI  +-------+-----------+-----------+------------+------------+  Right 0.60       0.59                                 +-------+-----------+-----------+------------+------------+  Left  0.76       0.67                                 +-------+-----------+-----------+------------+------------+   Lower Extremity Arterial duplex: Summary:  Right: No evidence of popliteal artery aneurysm.   Left: No evidence of popliteal artery aneurysm.    ASSESSMENT/PLAN:: 83 y.o. female here for follow up for AAA. She has history of infrarenal AAA that we have been following. 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 She does have what sounds like buttock/ hip claudication. 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 She does not have rest pain or tissue loss.  - Encourage walking regimen - She does not have any popliteal artery aneurysms - ABI today shows moderate arterial disease BLE - AAA duplex shows overall stable size of aneurysm with maximum diameter of 5 cm - continue statin and Aspirin - She will follow up again in 6 months with AAA duplex  Teretha Damme, PA-C [facility] [phone]  Clinic MD: Magda

## 2023-05-28 ENCOUNTER — Other Ambulatory Visit: Payer: Self-pay

## 2023-05-28 DIAGNOSIS — I714 Abdominal aortic aneurysm, without rupture, unspecified: Secondary | ICD-10-CM

## 2023-09-25 ENCOUNTER — Emergency Department (HOSPITAL_COMMUNITY)

## 2023-09-25 ENCOUNTER — Other Ambulatory Visit: Payer: Self-pay

## 2023-09-25 ENCOUNTER — Encounter (HOSPITAL_COMMUNITY): Payer: Self-pay

## 2023-09-25 ENCOUNTER — Emergency Department (HOSPITAL_COMMUNITY)
Admission: EM | Admit: 2023-09-25 | Discharge: 2023-09-26 | Disposition: A | Discharge status: Home / Self Care | Attending: Emergency Medicine | Admitting: Emergency Medicine

## 2023-09-25 DIAGNOSIS — M25562 Pain in left knee: Secondary | ICD-10-CM | POA: Insufficient documentation

## 2023-09-25 DIAGNOSIS — S0990XA Unspecified injury of head, initial encounter: Secondary | ICD-10-CM | POA: Diagnosis present

## 2023-09-25 DIAGNOSIS — Y9301 Activity, walking, marching and hiking: Secondary | ICD-10-CM | POA: Diagnosis not present

## 2023-09-25 DIAGNOSIS — Y92511 Restaurant or cafe as the place of occurrence of the external cause: Secondary | ICD-10-CM | POA: Insufficient documentation

## 2023-09-25 DIAGNOSIS — S8992XA Unspecified injury of left lower leg, initial encounter: Secondary | ICD-10-CM | POA: Diagnosis present

## 2023-09-25 DIAGNOSIS — I10 Essential (primary) hypertension: Secondary | ICD-10-CM | POA: Insufficient documentation

## 2023-09-25 DIAGNOSIS — M25532 Pain in left wrist: Secondary | ICD-10-CM | POA: Insufficient documentation

## 2023-09-25 DIAGNOSIS — W01198A Fall on same level from slipping, tripping and stumbling with subsequent striking against other object, initial encounter: Secondary | ICD-10-CM | POA: Insufficient documentation

## 2023-09-25 DIAGNOSIS — S82042A Displaced comminuted fracture of left patella, initial encounter for closed fracture: Secondary | ICD-10-CM | POA: Insufficient documentation

## 2023-09-25 DIAGNOSIS — M542 Cervicalgia: Secondary | ICD-10-CM | POA: Diagnosis not present

## 2023-09-25 MED ORDER — LIDOCAINE-EPINEPHRINE (PF) 2 %-1:200000 IJ SOLN
20.0000 mL | Freq: Once | INTRAMUSCULAR | Status: AC
  Administered 2023-09-25: 20 mL
  Filled 2023-09-25: qty 20

## 2023-09-25 NOTE — ED Notes (Signed)
 Pt refusing CT scans of head. RN and CT tech explained importance and reasoning for scans.

## 2023-09-25 NOTE — ED Triage Notes (Addendum)
 Pt arrives POV for fall. Pt reports she was walking out of the a restaurant and tripped and fell. Pt reports pain to left knee and reports hitting her left cheek on the ground. Pt denies dizziness or other symptoms. Pt does not take blood thinners. Pt denies neck pain.

## 2023-09-25 NOTE — ED Provider Notes (Signed)
 [place] EMERGENCY DEPARTMENT AT [facility] Provider Note   CSN: [id number] Arrival date & time: 09/25/23  2017     Patient presents with: Iysis Kathleen Morton is a 83 y.o. female with medical history of breast aortic aneurysm, anemia, cirrhosis, DJD, hypertension.  Patient presents to ED for evaluation of fall.  States that she was walking out of a restaurant this evening when she lost her footing and fell face forward.  She states that she struck the left side of her face but denies loss of consciousness.  Denies blood thinners.  Here endorsing left wrist pain, left knee pain, neck pain.  Denies headache, loss of consciousness, chest pain, shortness of breath.  Denies preceding chest pain or shortness of breath prior to fall.  Denies any other injuries.    Fall       Prior to Admission medications   Medication Sig Start Date End Date Taking? Authorizing Provider  ondansetron (ZOFRAN) 4 MG tablet Take 1 tablet (4 mg total) by mouth every 6 (six) hours as needed for nausea or vomiting. 09/26/23  Yes Adel Aden, PA-C  traMADol (ULTRAM) 50 MG tablet Take 1 tablet (50 mg total) by mouth every 6 (six) hours as needed. 09/26/23  Yes Adel Aden, PA-C  aspirin 81 MG chewable tablet Chew by mouth daily. Patient not taking: Reported on 05/18/2023    [provider]  Cholecalciferol (D3 ADULT PO) Take by mouth.    [provider]  DOXYLAMINE SUCCINATE, SLEEP, PO Take by mouth.    [provider]  losartan (COZAAR) 25 MG tablet Take 25 mg by mouth every evening. 10/07/18   [provider]  meclizine (ANTIVERT) 25 MG tablet TAKE 1 TABLET EVERY 6 HOURS AS NEEDED FOR DIZZINESS; TAKE 1/2 TO 1 TABLET AS NEEDED FOR VERTIGO SYMPTOMS. 11/03/18   [provider]  Multiple Vitamin (MULTIVITAMIN) tablet Take 1 tablet by mouth daily.    [provider]  Multiple Vitamins-Minerals (OCUVITE PO) Take by mouth.    [provider]  pioglitazone (ACTOS) 30 MG tablet Take 15 mg by mouth daily. 11/09/18   [provider]  pravastatin (PRAVACHOL) 20 MG tablet Take 20 mg by mouth once a week. 11/19/18   [provider]    Allergies: Meperidine and related, Diphenhydramine, and Tetanus toxoids    Review of Systems  All other systems reviewed and are negative.   Updated Vital Signs BP (!) 145/87 (BP Location: Left Arm)   Pulse (!) 103   Temp 98.5 F (36.9 C) (Oral)   Resp 18   Ht 4' 11 (1.499 m)   Wt 68 kg   SpO2 95%   BMI 30.30 kg/m   Physical Exam Vitals and nursing note reviewed.  Constitutional:      General: She is not in acute distress.    Appearance: She is well-developed.  HENT:     Head: Normocephalic and atraumatic.   Eyes:     Conjunctiva/sclera: Conjunctivae normal.    Cardiovascular:     Rate and Rhythm: Normal rate and regular rhythm.     Heart sounds: No murmur heard. Pulmonary:     Effort: Pulmonary effort is normal. No respiratory distress.     Breath sounds: Normal breath sounds.  Abdominal:     Palpations: Abdomen is soft.     Tenderness: There is no abdominal tenderness.   Musculoskeletal:        General: No swelling.  Cervical back: Neck supple.     Comments: Reduced ROM of left knee.  Left knee with swelling.   Skin:    General: Skin is warm and dry.     Capillary Refill: Capillary refill takes less than 2 seconds.   Neurological:     General: No focal deficit present.     Mental Status: She is alert and oriented to person, place, and time. Mental status is at baseline.     GCS: GCS eye subscore is 4. GCS verbal subscore is 5. GCS motor subscore is 6.     Cranial Nerves: Cranial nerves 2-12 are intact. No cranial nerve deficit.     Sensory: Sensation is intact. No sensory deficit.     Motor: Motor function is intact. No weakness.     Coordination: Coordination is intact. Heel to [facility] Test normal.     Comments: CN III through XII  intact.  Intact finger-to-nose, heel-to-shin.  No pronator drift, no slurred speech, no facial droop.  5 out of 5 strength bilateral upper extremities.  5 out of 5 strength bilateral lower extremities.  PERRL.  Tracks past midline.  Psychiatric:        Mood and Affect: Mood normal.     (all labs ordered are listed, but only abnormal results are displayed) Labs Reviewed - No data to display  EKG: None  Radiology: CT Head Wo Contrast Result Date: 09/25/2023 EXAM: CT HEAD, FACIAL BONES AND CERVICAL SPINE WITHOUT IV CONTRAST. 09/25/2023 11:34:58 PM TECHNIQUE: CT of the head, facial bones and cervical spine was performed without the administration of intravenous contrast. Multiplanar reformatted images are provided for review. Automated exposure control, iterative reconstruction, and/or weight based adjustment of the mA/kV was utilized to reduce the radiation dose to as low as reasonably achievable. COMPARISON: None available. CLINICAL HISTORY: Facial trauma, blunt. FINDINGS: CT HEAD BRAIN/VENTRICLES: There is no acute intracranial hemorrhage, mass effect or midline shift. No abnormal extra-axial fluid collection. The gray-white differentiation is maintained without an acute infarct. There is no hydrocephalus. Mild subcortical and periventricular small vessel ischemic changes. CT FACIAL BONES FACIAL BONES: The maxilla, pterygoid plates and zygomatic arches are intact. The mandible is intact. The mandibular condyles are normally situated. The nasal bones and maxillary nasal processes are intact. ORBITS: The globes appear intact. The extraocular muscles, optic nerve sheath complexes and lacrimal glands appear unremarkable. No retrobulbar hematoma or mass is seen. The orbital walls and rims are intact. SINUSES AND MASTOIDS: The paranasal sinuses and mastoid air cells are well aerated. No acute fracture is seen. SOFT TISSUES: No acute abnormality of the soft tissues. CERVICAL SPINE: BONES AND ALIGNMENT: There  is no acute fracture or traumatic malalignment. DEGENERATIVE CHANGES: Mild degenerative changes at C6-7. SOFT TISSUES: There is no prevertebral soft tissue swelling. IMPRESSION: 1. No acute intracranial abnormality. 2. No acute fracture in the cervical spine. 3. No acute traumatic injury of the facial bones. Electronically signed by: Zadie Herter MD 09/25/2023 11:39 PM EDT RP Workstation: [id number]   CT Cervical Spine Wo Contrast Result Date: 09/25/2023 EXAM: CT HEAD, FACIAL BONES AND CERVICAL SPINE WITHOUT IV CONTRAST. 09/25/2023 11:34:58 PM TECHNIQUE: CT of the head, facial bones and cervical spine was performed without the administration of intravenous contrast. Multiplanar reformatted images are provided for review. Automated exposure control, iterative reconstruction, and/or weight based adjustment of the mA/kV was utilized to reduce the radiation dose to as low as reasonably achievable. COMPARISON: None available. CLINICAL HISTORY: Facial trauma, blunt. FINDINGS: CT HEAD BRAIN/VENTRICLES:  There is no acute intracranial hemorrhage, mass effect or midline shift. No abnormal extra-axial fluid collection. The gray-white differentiation is maintained without an acute infarct. There is no hydrocephalus. Mild subcortical and periventricular small vessel ischemic changes. CT FACIAL BONES FACIAL BONES: The maxilla, pterygoid plates and zygomatic arches are intact. The mandible is intact. The mandibular condyles are normally situated. The nasal bones and maxillary nasal processes are intact. ORBITS: The globes appear intact. The extraocular muscles, optic nerve sheath complexes and lacrimal glands appear unremarkable. No retrobulbar hematoma or mass is seen. The orbital walls and rims are intact. SINUSES AND MASTOIDS: The paranasal sinuses and mastoid air cells are well aerated. No acute fracture is seen. SOFT TISSUES: No acute abnormality of the soft tissues. CERVICAL SPINE: BONES AND ALIGNMENT: There is no  acute fracture or traumatic malalignment. DEGENERATIVE CHANGES: Mild degenerative changes at C6-7. SOFT TISSUES: There is no prevertebral soft tissue swelling. IMPRESSION: 1. No acute intracranial abnormality. 2. No acute fracture in the cervical spine. 3. No acute traumatic injury of the facial bones. Electronically signed by: Zadie Herter MD 09/25/2023 11:39 PM EDT RP Workstation: [id number]   CT Maxillofacial Wo Contrast Result Date: 09/25/2023 EXAM: CT HEAD, FACIAL BONES AND CERVICAL SPINE WITHOUT IV CONTRAST. 09/25/2023 11:34:58 PM TECHNIQUE: CT of the head, facial bones and cervical spine was performed without the administration of intravenous contrast. Multiplanar reformatted images are provided for review. Automated exposure control, iterative reconstruction, and/or weight based adjustment of the mA/kV was utilized to reduce the radiation dose to as low as reasonably achievable. COMPARISON: None available. CLINICAL HISTORY: Facial trauma, blunt. FINDINGS: CT HEAD BRAIN/VENTRICLES: There is no acute intracranial hemorrhage, mass effect or midline shift. No abnormal extra-axial fluid collection. The gray-white differentiation is maintained without an acute infarct. There is no hydrocephalus. Mild subcortical and periventricular small vessel ischemic changes. CT FACIAL BONES FACIAL BONES: The maxilla, pterygoid plates and zygomatic arches are intact. The mandible is intact. The mandibular condyles are normally situated. The nasal bones and maxillary nasal processes are intact. ORBITS: The globes appear intact. The extraocular muscles, optic nerve sheath complexes and lacrimal glands appear unremarkable. No retrobulbar hematoma or mass is seen. The orbital walls and rims are intact. SINUSES AND MASTOIDS: The paranasal sinuses and mastoid air cells are well aerated. No acute fracture is seen. SOFT TISSUES: No acute abnormality of the soft tissues. CERVICAL SPINE: BONES AND ALIGNMENT: There is no acute  fracture or traumatic malalignment. DEGENERATIVE CHANGES: Mild degenerative changes at C6-7. SOFT TISSUES: There is no prevertebral soft tissue swelling. IMPRESSION: 1. No acute intracranial abnormality. 2. No acute fracture in the cervical spine. 3. No acute traumatic injury of the facial bones. Electronically signed by: Zadie Herter MD 09/25/2023 11:39 PM EDT RP Workstation: [id number]   DG Wrist Complete Left Result Date: 09/25/2023 CLINICAL DATA:  Fall with left wrist pain EXAM: LEFT WRIST - COMPLETE 3+ VIEW COMPARISON:  None Available. FINDINGS: Demineralization. No acute fracture or dislocation. Arthritis about the first [facility] and radiocarpal joints. IMPRESSION: No acute fracture or dislocation. Electronically Signed   By: Rozell Cornet M.D.   On: 09/25/2023 23:02   DG Knee Complete 4 Views Left Result Date: 09/25/2023 CLINICAL DATA:  Marvell Slider, left knee pain EXAM: LEFT KNEE - COMPLETE 4+ VIEW COMPARISON:  None Available. FINDINGS: Frontal, bilateral oblique, and lateral views of the left knee are obtained. There is a comminuted minimally distracted patellar fracture, with 3-4 mm of distraction along the inferior fracture line. Large left knee effusion.  Mild 3 compartmental osteoarthritis greatest medially. IMPRESSION: 1. Comminuted minimally distracted patellar fracture, with large left knee effusion. 2. Mild 3 compartmental osteoarthritis. Electronically Signed   By: Bobbye Burrow M.D.   On: 09/25/2023 21:08     Procedures   Medications Ordered in the ED  lidocaine-EPINEPHrine (XYLOCAINE W/EPI) 2 %-1:200000 (PF) injection 20 mL (has no administration in time range)  fentaNYL (SUBLIMAZE) injection 25 mcg (25 mcg Intramuscular Given 09/26/23 0155)       Medical Decision Making Amount and/or Complexity of Data Reviewed Radiology: ordered.  Risk Prescription drug management.   83 year old female presents for evaluation.  Please see HPI for further details.  On exam patient is  afebrile and nontachycardic.  Her lung sounds are clear bilaterally, she is nonhypoxic.  Her abdomen is soft and compressible.  Her neurological examination is at baseline.  She has reduced range of motion of the left knee.  She has no focal neurodeficits.  She is overall nontoxic in appearance.  She has tenderness to her left wrist but full range of motion.  Will collect CT head, cervical spine, maxillofacial, plan for imaging of the wrist and plain film imaging of left knee.  Plain film imaging of left wrist unremarkable.  CT head, cervical spine, maxillofacial scans unremarkable.  Plain film imaging of left knee shows comminuted and minimally displaced patellar fracture with large left knee effusion.  I attempted to drain some of this effusion however unsuccessful, most likely due to hemarthrosis.  Patient placed in knee immobilizer.  Given 25 mcg fentanyl pain.  Patient provided with crutches, able to ambulate.  Patient will be discharged at this time with pain medication.  She states that she has tried taking oxycodone in the past but she has negative side effects.  Will try tramadol at home.  She was advised to follow-up with the orthopedic provider.  She had return precautions given her and she voiced understanding.  Stable to discharge.  Final diagnoses:  Closed displaced comminuted fracture of left patella, initial encounter    ED Discharge Orders          Ordered    traMADol (ULTRAM) 50 MG tablet  Every 6 hours PRN        09/26/23 0211    ondansetron (ZOFRAN) 4 MG tablet  Every 6 hours PRN        09/26/23 0211               Adel Aden, PA-C 09/26/23 0212    Palumbo, April, MD 09/26/23 1308

## 2023-09-26 ENCOUNTER — Telehealth (HOSPITAL_COMMUNITY): Payer: Self-pay | Admitting: Emergency Medicine

## 2023-09-26 DIAGNOSIS — S82042A Displaced comminuted fracture of left patella, initial encounter for closed fracture: Secondary | ICD-10-CM | POA: Diagnosis not present

## 2023-09-26 MED ORDER — ONDANSETRON HCL 4 MG PO TABS: 4.0000 mg | ORAL_TABLET | Freq: Four times a day (QID) | ORAL | 0 refills | Status: DC | PRN

## 2023-09-26 MED ORDER — FENTANYL CITRATE PF 50 MCG/ML IJ SOSY
25.0000 ug | PREFILLED_SYRINGE | Freq: Once | INTRAMUSCULAR | Status: AC
  Administered 2023-09-26: 25 ug via INTRAMUSCULAR
  Filled 2023-09-26 (×2): qty 1

## 2023-09-26 MED ORDER — ONDANSETRON HCL 4 MG PO TABS: 4.0000 mg | ORAL_TABLET | Freq: Four times a day (QID) | ORAL | 0 refills | Status: AC | PRN

## 2023-09-26 MED ORDER — TRAMADOL HCL 50 MG PO TABS: 50.0000 mg | ORAL_TABLET | Freq: Four times a day (QID) | ORAL | 0 refills | Status: DC | PRN

## 2023-09-26 MED ORDER — TRAMADOL HCL 50 MG PO TABS: 50.0000 mg | ORAL_TABLET | Freq: Four times a day (QID) | ORAL | 0 refills | Status: AC | PRN

## 2023-09-26 NOTE — Telephone Encounter (Deleted)
 Kathleen Morton

## 2023-09-26 NOTE — Discharge Instructions (Addendum)
 It was a pleasure taking part in your care.  As discussed, you have a fracture in your patella of the left knee.  Please follow-up with Dr. Cherl Corner of orthopedic services.  Please remain in the knee immobilizer until you see Dr. Aminophylline.  You may use crutches to help get around the house.  Please take tramadol 50 mg once every 6 hours as needed for pain.  Please take Zofran every 6 hours as needed for nausea.  Return to the ED with any new or worsening symptoms.  You may apply partial weight to left lower extremity until seen by orthopedics.

## 2023-09-27 ENCOUNTER — Other Ambulatory Visit: Payer: Self-pay | Admitting: *Deleted

## 2023-09-27 NOTE — Patient Outreach (Signed)
 Received notification from Bonnell Butcher, Medical Neighborhoods Director that Dr. Arlena Lacrosse (PCP) has reached out to her for assistance for admission to SNF under [facility] SNF waiver. Mrs. Glidden qualifies for [facility] SNF waiver. Writer informed son Chip [phone] is primary contact.  Telephone call made to son Chip [phone]. Patient identifiers confirmed. Chip confirms Mrs. Barrette fell on Saturday and fractured her patella. Went to ED. Sent home. Now at home, family unable to care for Mrs. Paglia and they are seeking placement. Chip states their preference is [facility]. [facility] is a [facility] SNF waiver facility.   Spoke with Ginette Lah Admissions Director. Sherrlyn Dolores is on vacation currently. However, the following information is needed: FL2, PASAAR, doctor's order, progress notes, TB test. Sherrlyn Dolores provided fax number for [facility] as [phone]. Sherrlyn Dolores advised Clinical research associate that Andrew Kearns is covering Admissions while she is on vacation.   Telephone call made to [facility] who states [facility] does have bed availability. However, [employer], Administrator will be primary contact for admission to [facility].   Telephone call made to [facility] at Dr. Arlena Lacrosse' office. Provided Courtney with [facility]'s fax number for FL2 and order and request for TB test.   Telephone call made to Chip to keep updated.   Reached out to [employer] for assistance with [employer].   Telephone call made to [facility] [place] to confirm receipt of information. Grant reports FL2 not received yet. However, he did receive faxed ED notes via EPIC.   Message left for Courtney with PCP office to follow up on FL2 and order request to be faxed to [facility] at [phone].  Nolberto Batty, MSN, RN, BSN [place]  [facility], Healthy Communities RN Post- Acute Care Manager Direct Dial: [phone]

## 2023-09-27 NOTE — Patient Outreach (Addendum)
 Confirmed with Kathleen Morton, [facility] SN[facility] Administrator that they can offer a bed to Kathleen Morton. All information has been received. Kathleen Morton states she will contact son Kathleen Morton to discuss staff administering TB test at Kathleen Morton's home prior to SNF admission. Discussed Kathleen Morton is eligible for [facility] SNF waiver which will be used for admission.   Kathleen Morton states Kathleen Morton)  will follow up with son and family regarding transportation agencies to transport Kathleen Morton to SNF. Kathleen Morton will admit to [facility] SNF some time this week under [facility] SN[facility] waiver.  Writer working on Product manager.   AddendumSherel Morton number obtained [phone]. Securely sent PASRR number to Kathleen Morton, Nurse, mental health and Kathleen Dolores, Insurance claims handler.   Kathleen Morton reports Camera operator to Kathleen Morton's home to administer TB test today. States the plan is for admission to [facility] SNF on Wednesday, June 18th.   Spoke with Kathleen Morton to confirm plans.     Kathleen Batty, MSN, RN, BSN [place]  [facility], Healthy Communities RN Post- Acute Care Manager Direct Dial: [phone]

## 2023-09-29 ENCOUNTER — Other Ambulatory Visit: Payer: Self-pay | Admitting: *Deleted

## 2023-09-29 NOTE — Patient Outreach (Signed)
 Post-Acute Care Manager follow up.   Telephone call made to Chip (son). Chip confirms Mrs. Devincenzi admitted to [facility] SNF today 09/29/23. [facility] SNF waiver utilized for admission to SNF from home.    Writer will continue to follow while Mrs. Cribb resides in [place].   Nolberto Batty, MSN, RN, BSN [place]  [facility], Healthy Communities RN Post- Acute Care Manager Direct Dial: [phone]

## 2023-10-13 ENCOUNTER — Other Ambulatory Visit: Payer: Self-pay | Admitting: *Deleted

## 2023-10-13 NOTE — Patient Outreach (Signed)
 Post-Acute Care Manager follow up. Mrs. Manka resides in [place]. [facility] SNF waiver utilized for admission to SNF.  Telephone call made to son Chip. Chip reports Mrs. Middlekauff is doing well. Anticipated transition plan is to return home. No discharge date set yet. Continues to work with therapy.   Secure communication sent to [place], SNF social worker for collaboration.   Will continue to follow.    Pablo Hurst, MSN, RN, BSN [place]  [facility], Healthy Communities RN Post- Acute Care Manager Direct Dial: [phone]

## 2023-11-17 ENCOUNTER — Other Ambulatory Visit: Payer: Self-pay | Admitting: *Deleted

## 2023-11-17 NOTE — Patient Outreach (Addendum)
 Post-Acute Care Manager follow up. Per [facility], Mrs. Luton resides in [place] SNF.  [facility] SNF waiver utilized for admission to [facility].  Secure communication sent to [place], SNF social worker to request update on transition plans and barriers.  Addendum: Secure update received from [place], SNF social worker. Mrs. Mccormick will return home with spouse on tomorrow, August 7th with [facility] for PT/OT services. Harlene reports Mrs. Coventry has met all of her therapy goals.   Telephone call made to Chip (son) to discuss VBCI CCM follow up. No answer. Phone rang indefinitely. Will plan to refer for VBCI complex care management post SNF discharge.  Pablo Hurst, MSN, RN, BSN [place]  [facility], Healthy Communities RN Post- Acute Care Manager Direct Dial: [phone]

## 2023-11-19 ENCOUNTER — Other Ambulatory Visit: Payer: Self-pay | Admitting: *Deleted

## 2023-11-19 DIAGNOSIS — I10 Essential (primary) hypertension: Secondary | ICD-10-CM

## 2023-11-19 NOTE — Patient Outreach (Signed)
 Post-Acute Care Manager follow up. Per [facility], Mrs. Bultman discharged from [facility] on 11/18/23. Returned home with spouse and will have [facility] per SNF social worker.  Will refer for VBCI CCM services as benefit of health plan and PCP- Dr. Landry Georgi.  Mrs. Boardley has medical history of AAA, HTN, HLD, liver cirrhosis, and recent fall.    Pablo Hurst, MSN, RN, BSN [place]  [facility], Healthy Communities RN Post- Acute Care Manager Direct Dial: [phone]

## 2023-11-23 ENCOUNTER — Other Ambulatory Visit (HOSPITAL_COMMUNITY): Payer: BLUE CROSS/BLUE SHIELD

## 2023-11-23 ENCOUNTER — Ambulatory Visit: Payer: BLUE CROSS/BLUE SHIELD

## 2023-11-29 ENCOUNTER — Telehealth: Payer: Self-pay

## 2023-11-29 NOTE — Progress Notes (Signed)
 Complex Care Management Note Care Guide Note  11/29/2023 Name: Kathleen Morton MRN: [id number] DOB: October 02, 1940   Complex Care Management Outreach Attempts: An unsuccessful telephone outreach was attempted today to offer the patient information about available complex care management services.  Follow Up Plan:  Additional outreach attempts will be made to offer the patient complex care management information and services.   Encounter Outcome:  No Answer  Dreama Lynwood [facility]  [facility], [facility] VBCI Assistant Direct Dial: [phone]  Fax: [phone]

## 2023-12-01 NOTE — Progress Notes (Signed)
 Complex Care Management Note Care Guide Note  12/01/2023 Name: Kathleen Morton MRN: [id number] DOB: April 18, 1940   Complex Care Management Outreach Attempts: A second unsuccessful outreach was attempted today to offer the patient with information about available complex care management services.  Follow Up Plan:  Additional outreach attempts will be made to offer the patient complex care management information and services.   Encounter Outcome:  Patient Request to Call Back  Kathleen Morton [facility]  [facility], [facility] VBCI Assistant Direct Dial: [phone]  Fax: [phone]

## 2023-12-10 NOTE — Progress Notes (Signed)
 Complex Care Management Note Care Guide Note  12/10/2023 Name: Kathleen Morton MRN: [id number] DOB: Apr 08, 1941   Complex Care Management Outreach Attempts: A third unsuccessful outreach was attempted today to offer the patient with information about available complex care management services.  Follow Up Plan:  Additional outreach attempts will be made to offer the patient complex care management information and services.   Encounter Outcome:  Patient Request to Call Back  Dreama Lynwood [facility]  [facility], [facility] VBCI Assistant Direct Dial: [phone]  Fax: [phone]

## 2024-01-19 ENCOUNTER — Ambulatory Visit (HOSPITAL_BASED_OUTPATIENT_CLINIC_OR_DEPARTMENT_OTHER): Admission: EM | Admit: 2024-01-19 | Discharge: 2024-01-19 | Disposition: A | Discharge status: Home / Self Care

## 2024-01-19 ENCOUNTER — Encounter (HOSPITAL_BASED_OUTPATIENT_CLINIC_OR_DEPARTMENT_OTHER): Payer: Self-pay | Admitting: Emergency Medicine

## 2024-01-19 DIAGNOSIS — H9202 Otalgia, left ear: Secondary | ICD-10-CM | POA: Diagnosis not present

## 2024-01-19 DIAGNOSIS — H6122 Impacted cerumen, left ear: Secondary | ICD-10-CM

## 2024-01-19 NOTE — Medical Student Note (Signed)
 [facility] URGENT CARE Provider Student Note For educational purposes for Medical, PA and NP students only and not part of the legal medical record.   CSN: [id number] Arrival date & time: 01/19/24  1932      History   Chief Complaint Chief Complaint  Patient presents with   Otalgia    HPI Kathleen Morton is a 83 y.o. female.  Pt has a hx of CAD, HLD, HTN. She saw her PCP 2 weeks for cerumen impaction where they attempted to irrigate, but was unable to get it out. She was prescribed Debrox gtts to soften the wax. Pt has been using the Debrox gtts and this AM she used a bulb syringe to flush her L ear out. This evening she had a sudden onset of otalgia in the L ear. She has been having some sinus pressure and nasal congestion for the past week. She denies any fever, chills, or sore throat.    Otalgia Associated symptoms: congestion   Associated symptoms: no fever and no sore throat     Past Medical History:  Diagnosis Date   Anemia    Aneurysm of infrarenal abdominal aorta    Fusiform.  4.6cm   Aortic atherosclerosis    Cirrhosis of liver (HCC)    Coronary artery calcification    Diverticulosis    Descending and Sigmoid   DJD (degenerative joint disease)    Mild facet in lower lumbar spine.   Hyperlipidemia    Hypertension    Mallory-Weiss tear    Osteopenia    Renal cyst    3.2 cm   Rosacea    Thoracic aortic aneurysm 10/30/2018   Distal 3.7cm    There are no active problems to display for this patient.   Past Surgical History:  Procedure Laterality Date   ABDOMINAL HYSTERECTOMY     TONSILLECTOMY AND ADENOIDECTOMY      OB History   No obstetric history on file.      Home Medications    Prior to Admission medications   Medication Sig Start Date End Date Taking? Authorizing Provider  gabapentin (NEURONTIN) 100 MG capsule Take 100 mg by mouth at bedtime. 12/30/23  Yes [provider]  losartan (COZAAR) 25 MG tablet Take 25 mg by mouth  every evening. 10/07/18  Yes [provider]  meclizine (ANTIVERT) 25 MG tablet TAKE 1 TABLET EVERY 6 HOURS AS NEEDED FOR DIZZINESS; TAKE 1/2 TO 1 TABLET AS NEEDED FOR VERTIGO SYMPTOMS. 11/03/18  Yes [provider]  aspirin 81 MG chewable tablet Chew by mouth daily. Patient not taking: Reported on 05/18/2023    [provider]  Cholecalciferol (D3 ADULT PO) Take by mouth.    [provider]  DOXYLAMINE SUCCINATE, SLEEP, PO Take by mouth.    [provider]  Multiple Vitamin (MULTIVITAMIN) tablet Take 1 tablet by mouth daily.    [provider]  Multiple Vitamins-Minerals (OCUVITE PO) Take by mouth.    [provider]  ondansetron  (ZOFRAN ) 4 MG tablet Take 1 tablet (4 mg total) by mouth every 6 (six) hours as needed for nausea or vomiting. 09/26/23   Ruthell Lonni FALCON, PA-C  pioglitazone (ACTOS) 30 MG tablet Take 15 mg by mouth daily. 11/09/18   [provider]  pravastatin (PRAVACHOL) 20 MG tablet Take 20 mg by mouth once a week. 11/19/18   [provider]  traMADol  (ULTRAM ) 50 MG tablet Take 1 tablet (50 mg total) by mouth every 6 (six) hours as needed.  09/26/23   Ruthell Lonni FALCON, PA-C    Family History Family History  Problem Relation Age of Onset   Hypertension Mother    Hypertension Father    Heart disease Father    Cancer Brother     Social History Social History   Tobacco Use   Smoking status: Former    Passive exposure: Never   Smokeless tobacco: Never  Vaping Use   Vaping status: Never Used  Substance Use Topics   Alcohol use: Yes    Comment: MInimal   Drug use: Never     Allergies   Meperidine and related, Diphenhydramine, and Tetanus toxoid-containing vaccines   Review of Systems Review of Systems  Constitutional:  Negative for chills and fever.  HENT:  Positive for congestion, ear pain and sinus pressure. Negative for sore throat.   All other systems reviewed and are  negative.    Physical Exam Updated Vital Signs BP (!) 165/91 (BP Location: Right Arm)   Pulse 93   Temp 97.8 F (36.6 C) (Oral)   Resp 18   SpO2 93%   Physical Exam Constitutional:      Appearance: Normal appearance.  HENT:     Right Ear: Tympanic membrane, ear canal and external ear normal.     Left Ear: Ear canal and external ear normal. There is impacted cerumen.     Nose: Nose normal.     Mouth/Throat:     Pharynx: Oropharynx is clear. No posterior oropharyngeal erythema.  Cardiovascular:     Rate and Rhythm: Normal rate and regular rhythm.     Pulses: Normal pulses.     Heart sounds: Normal heart sounds.  Pulmonary:     Effort: Pulmonary effort is normal.     Breath sounds: Normal breath sounds.  Musculoskeletal:     Cervical back: Neck supple.  Lymphadenopathy:     Cervical: No cervical adenopathy.  Neurological:     Mental Status: She is alert.      ED Treatments / Results  Labs (all labs ordered are listed, but only abnormal results are displayed) Labs Reviewed - No data to display  EKG  Radiology No results found.  Procedures Procedures (including critical care time)  Medications Ordered in ED Medications - No data to display   Initial Impression / Assessment and Plan / ED Course  I have reviewed the triage vital signs and the nursing notes.  Pertinent labs & imaging results that were available during my care of the patient were reviewed by me and considered in my medical decision making (see chart for details).    Irrigation was performed to the L ear with successful clearance of cerumen. Follow up exam reveals mildly erythematous canal and TM presumed to be from irrigation procedure. Her hearing has improved in the L ear, but has not fully resolved. Pt has appointment with ENT on 10/13. Encouraged pt to keep appointment. If otalgia doesn't resolve pt encouraged to return sooner for follow up.    Final Clinical Impressions(s) / ED Diagnoses    Final diagnoses:  Hearing loss of left ear due to cerumen impaction  Left ear pain    New Prescriptions New Prescriptions   No medications on file

## 2024-01-19 NOTE — ED Provider Notes (Signed)
 [facility]    CSN: [id number] Arrival date & time: 01/19/24  1932      History   Chief Complaint Chief Complaint  Patient presents with   Otalgia    HPI Kathleen Morton is a 83 y.o. female.   83 year old female here with her husband.  She had left ear pain that started on approximately 01/02/2024 or earlier.  Her primary care doctor tried to clean her ear without success.  She has been using Debrox drops and trying to rinse her ear.  Last night she felt like she had ear pain while trying to clean her ear, her hearing got worse also.  She has an appointment with ENT but that still several days or more than a week off.   Otalgia Associated symptoms: ear discharge   Associated symptoms: no abdominal pain, no cough, no diarrhea, no fever, no rash, no sore throat and no vomiting     Past Medical History:  Diagnosis Date   Anemia    Aneurysm of infrarenal abdominal aorta    Fusiform.  4.6cm   Aortic atherosclerosis    Cirrhosis of liver (HCC)    Coronary artery calcification    Diverticulosis    Descending and Sigmoid   DJD (degenerative joint disease)    Mild facet in lower lumbar spine.   Hyperlipidemia    Hypertension    Mallory-Weiss tear    Osteopenia    Renal cyst    3.2 cm   Rosacea    Thoracic aortic aneurysm 10/30/2018   Distal 3.7cm    There are no active problems to display for this patient.   Past Surgical History:  Procedure Laterality Date   ABDOMINAL HYSTERECTOMY     TONSILLECTOMY AND ADENOIDECTOMY      OB History   No obstetric history on file.      Home Medications    Prior to Admission medications   Medication Sig Start Date End Date Taking? Authorizing Provider  gabapentin (NEURONTIN) 100 MG capsule Take 100 mg by mouth at bedtime. 12/30/23  Yes [provider]  losartan (COZAAR) 25 MG tablet Take 25 mg by mouth every evening. 10/07/18  Yes [provider]  meclizine (ANTIVERT) 25 MG tablet TAKE 1 TABLET  EVERY 6 HOURS AS NEEDED FOR DIZZINESS; TAKE 1/2 TO 1 TABLET AS NEEDED FOR VERTIGO SYMPTOMS. 11/03/18  Yes [provider]  aspirin 81 MG chewable tablet Chew by mouth daily. Patient not taking: Reported on 05/18/2023    [provider]  Cholecalciferol (D3 ADULT PO) Take by mouth.    [provider]  DOXYLAMINE SUCCINATE, SLEEP, PO Take by mouth.    [provider]  Multiple Vitamin (MULTIVITAMIN) tablet Take 1 tablet by mouth daily.    [provider]  Multiple Vitamins-Minerals (OCUVITE PO) Take by mouth.    [provider]  ondansetron  (ZOFRAN ) 4 MG tablet Take 1 tablet (4 mg total) by mouth every 6 (six) hours as needed for nausea or vomiting. 09/26/23   Ruthell Lonni FALCON, PA-C  pioglitazone (ACTOS) 30 MG tablet Take 15 mg by mouth daily. 11/09/18   [provider]  pravastatin (PRAVACHOL) 20 MG tablet Take 20 mg by mouth once a week. 11/19/18   [provider]  traMADol  (ULTRAM ) 50 MG tablet Take 1 tablet (50 mg total) by mouth every 6 (six) hours as needed. 09/26/23   Ruthell Lonni FALCON, PA-C    Family History Family History  Problem Relation Age of Onset  Hypertension Mother    Hypertension Father    Heart disease Father    Cancer Brother     Social History Social History   Tobacco Use   Smoking status: Former    Passive exposure: Never   Smokeless tobacco: Never  Vaping Use   Vaping status: Never Used  Substance Use Topics   Alcohol use: Yes    Comment: MInimal   Drug use: Never     Allergies   Meperidine and related, Diphenhydramine, and Tetanus toxoid-containing vaccines   Review of Systems Review of Systems  Constitutional:  Negative for chills and fever.  HENT:  Positive for ear discharge and ear pain. Negative for sore throat.   Eyes:  Negative for pain and visual disturbance.  Respiratory:  Negative for cough and shortness of breath.   Cardiovascular:  Negative for chest pain and  palpitations.  Gastrointestinal:  Negative for abdominal pain, constipation, diarrhea, nausea and vomiting.  Genitourinary:  Negative for dysuria and hematuria.  Musculoskeletal:  Negative for arthralgias and back pain.  Skin:  Negative for color change and rash.  Neurological:  Negative for seizures and syncope.  All other systems reviewed and are negative.    Physical Exam Triage Vital Signs ED Triage Vitals  Encounter Vitals Group     BP 01/19/24 1947 (!) 165/91     Girls Systolic BP Percentile --      Girls Diastolic BP Percentile --      Boys Systolic BP Percentile --      Boys Diastolic BP Percentile --      Pulse Rate 01/19/24 1947 93     Resp 01/19/24 1947 18     Temp 01/19/24 1947 97.8 F (36.6 C)     Temp Source 01/19/24 1947 Oral     SpO2 01/19/24 1947 93 %     Weight --      Height --      Head Circumference --      Peak Flow --      Pain Score 01/19/24 1945 3     Pain Loc --      Pain Education --      Exclude from Growth Chart --    No data found.  Updated Vital Signs BP (!) 165/91 (BP Location: Right Arm)   Pulse 93   Temp 97.8 F (36.6 C) (Oral)   Resp 18   SpO2 93%   Visual Acuity Right Eye Distance:   Left Eye Distance:   Bilateral Distance:    Right Eye Near:   Left Eye Near:    Bilateral Near:     Physical Exam Vitals and nursing note reviewed.  Constitutional:      General: She is not in acute distress.    Appearance: She is well-developed. She is not ill-appearing or toxic-appearing.  HENT:     Head: Normocephalic and atraumatic.     Right Ear: Hearing, tympanic membrane, ear canal and external ear normal.     Left Ear: Hearing and external ear normal. There is impacted cerumen (Ear is completely blocked with wax.  TM is not visible.).     Ears:     Comments: Reassessment after ear lavage: Ear is completely clear.  Canal is slightly pink but that may be from the warm water flushes.  Tympanic membrane is normal.  Hearing is still  worse on the left than the right but significantly improved after the ear lavage.  Ear pain is not completely resolved but  is also significantly improved.    Nose: No congestion or rhinorrhea.     Right Sinus: No maxillary sinus tenderness or frontal sinus tenderness.     Left Sinus: No maxillary sinus tenderness or frontal sinus tenderness.     Mouth/Throat:     Lips: Pink.     Mouth: Mucous membranes are moist.     Pharynx: Uvula midline. No oropharyngeal exudate or posterior oropharyngeal erythema.     Tonsils: No tonsillar exudate.  Eyes:     Conjunctiva/sclera: Conjunctivae normal.     Pupils: Pupils are equal, round, and reactive to light.  Cardiovascular:     Rate and Rhythm: Normal rate and regular rhythm.     Heart sounds: S1 normal and S2 normal. No murmur heard. Pulmonary:     Effort: Pulmonary effort is normal. No respiratory distress.     Breath sounds: Normal breath sounds. No decreased breath sounds, wheezing, rhonchi or rales.  Abdominal:     General: Bowel sounds are normal.     Palpations: Abdomen is soft.     Tenderness: There is no abdominal tenderness.  Musculoskeletal:        General: No swelling.     Cervical back: Neck supple.  Lymphadenopathy:     Head:     Right side of head: No submental, submandibular, tonsillar, preauricular or posterior auricular adenopathy.     Left side of head: No submental, submandibular, tonsillar, preauricular or posterior auricular adenopathy.     Cervical: No cervical adenopathy.     Right cervical: No superficial cervical adenopathy.    Left cervical: No superficial cervical adenopathy.  Skin:    General: Skin is warm and dry.     Capillary Refill: Capillary refill takes less than 2 seconds.     Findings: No rash.  Neurological:     Mental Status: She is alert and oriented to person, place, and time.  Psychiatric:        Mood and Affect: Mood normal.      UC Treatments / Results  Labs (all labs ordered are listed,  but only abnormal results are displayed) Labs Reviewed - No data to display  EKG   Radiology No results found.  Procedures Procedures (including critical care time)  Medications Ordered in UC Medications - No data to display  Initial Impression / Assessment and Plan / UC Course  I have reviewed the triage vital signs and the nursing notes.  Pertinent labs & imaging results that were available during my care of the patient were reviewed by me and considered in my medical decision making (see chart for details).  Plan of Care: Left ear cerumen impaction with change in hearing and some ear pain: Ear lavage was successful and all wax removed.  Patient has an improvement in her hearing but it still feels like she is not hearing as well on the left ear.  Ear pain has also improved some.  Encouraged to see ENT as planned.  Follow-up if symptoms do not improve, worsen or new symptoms occur.  I reviewed the plan of care with the patient and/or the patient's guardian.  The patient and/or guardian had time to ask questions and acknowledged that the questions were answered.  I provided instruction on symptoms or reasons to return here or to go to an ER, if symptoms/condition did not improve, worsened or if new symptoms occurred.  Final Clinical Impressions(s) / UC Diagnoses   Final diagnoses:  Hearing loss of left ear due  to cerumen impaction  Left ear pain     Discharge Instructions      Left ear cerumen impaction with change in hearing and some ear pain: Ear lavage was successful and all wax removed.  Patient has an improvement in her hearing but it still feels like she is not hearing as well on the left ear.  Ear pain has also improved some.  Encouraged to see ENT as planned.  Follow-up if symptoms do not improve, worsen or new symptoms occur.     ED Prescriptions   None    PDMP not reviewed this encounter.   Ival Domino, FNP 01/19/24 2008

## 2024-01-19 NOTE — Discharge Instructions (Addendum)
 Left ear cerumen impaction with change in hearing and some ear pain: Ear lavage was successful and all wax removed.  Patient has an improvement in her hearing but it still feels like she is not hearing as well on the left ear.  Ear pain has also improved some.  Encouraged to see ENT as planned.  Follow-up if symptoms do not improve, worsen or new symptoms occur.

## 2024-01-19 NOTE — ED Triage Notes (Addendum)
 Pt c/o left ear pain started 2 weeks ago her doctor tried to clean her ear out but did not completely get it cleaned out she has been using drops she is having trouble hearing out of it and she is having pain to ear. Pt has a pending appointment with ENT to clean the ear out.

## 2024-02-15 ENCOUNTER — Ambulatory Visit (HOSPITAL_COMMUNITY)
Admission: RE | Admit: 2024-02-15 | Discharge: 2024-02-15 | Disposition: A | Discharge status: Home / Self Care | Source: Ambulatory Visit | Attending: Vascular Surgery | Admitting: Vascular Surgery

## 2024-02-15 ENCOUNTER — Ambulatory Visit: Admitting: Physician Assistant

## 2024-02-15 VITALS — BP 144/85 | HR 91 | Temp 97.7°F | Wt 163.0 lb

## 2024-02-15 DIAGNOSIS — I714 Abdominal aortic aneurysm, without rupture, unspecified: Secondary | ICD-10-CM | POA: Diagnosis present

## 2024-02-15 NOTE — Progress Notes (Signed)
 HISTORY AND PHYSICAL     CC:  follow up. Requesting Provider:  Trinidad Hun, MD  HPI: This is a 83 y.o. female who is here today for follow up for AAA.   She has an infrarenal AAA.  She has hx of portal HTN and COPD.  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  She was deemed not a candidate for open reconstruction and felt waiting until she is past the threshold before considering prophylactic endovascular aneurysm repair.  It was felt that angioplasty with lithotripsy may be a good option to try to create a channel for EVAR sheaths when she needs repair.   Pt was last seen 05/18/2023 and at that time, she was not having any new back or abdominal pain.  She did have some pain in her hips that she described as a grabbing pain that occurred after walking about 50 feet and it was mostly relieved with rest.    The pt returns today for follow up and here with her husband of 58 years.  She denies any new abdominal or back pain.  She states she hurt her left knee back June and required rehab.  She was hospitalized in [facility] in August for vomiting blood.  She states she was found to have a mild tear in her esophagus that was able to be cauterized.  She has done well since.  She did require rehab after that hospitalization as well.  She tells me she had a CT scan of her abdomen while in the hospital.    The pt is on a statin for cholesterol management.    The pt is on an aspirin.    Other AC:  none The pt is on ARB for hypertension.  The pt is not on medication diabetes. Tobacco hx:  former   Past Medical History:  Diagnosis Date   Anemia    Aneurysm of infrarenal abdominal aorta    Fusiform.  4.6cm   Aortic atherosclerosis    Cirrhosis of liver (HCC)    Coronary artery calcification    Diverticulosis    Descending and Sigmoid   DJD (degenerative joint disease)    Mild facet in lower lumbar spine.   Hyperlipidemia    Hypertension    Mallory-Weiss tear     Osteopenia    Renal cyst    3.2 cm   Rosacea    Thoracic aortic aneurysm 10/30/2018   Distal 3.7cm    Past Surgical History:  Procedure Laterality Date   ABDOMINAL HYSTERECTOMY     TONSILLECTOMY AND ADENOIDECTOMY      Allergies  Allergen Reactions   Meperidine And Related Nausea And Vomiting   Diphenhydramine    Tetanus Toxoid-Containing Vaccines     And Vaccines    Current Outpatient Medications  Medication Sig Dispense Refill   aspirin 81 MG chewable tablet Chew by mouth daily. (Patient not taking: Reported on 05/18/2023)     Cholecalciferol (D3 ADULT PO) Take by mouth.     DOXYLAMINE SUCCINATE, SLEEP, PO Take by mouth.     gabapentin (NEURONTIN) 100 MG capsule Take 100 mg by mouth at bedtime.     losartan (COZAAR) 25 MG tablet Take 25 mg by mouth every evening.     meclizine (ANTIVERT) 25 MG tablet TAKE 1 TABLET EVERY 6 HOURS AS NEEDED FOR DIZZINESS; TAKE 1/2 TO 1 TABLET AS NEEDED FOR VERTIGO SYMPTOMS.     Multiple Vitamin (MULTIVITAMIN) tablet Take 1 tablet by  mouth daily.     Multiple Vitamins-Minerals (OCUVITE PO) Take by mouth.     ondansetron  (ZOFRAN ) 4 MG tablet Take 1 tablet (4 mg total) by mouth every 6 (six) hours as needed for nausea or vomiting. 12 tablet 0   pioglitazone (ACTOS) 30 MG tablet Take 15 mg by mouth daily.     pravastatin (PRAVACHOL) 20 MG tablet Take 20 mg by mouth once a week.     traMADol  (ULTRAM ) 50 MG tablet Take 1 tablet (50 mg total) by mouth every 6 (six) hours as needed. 15 tablet 0   No current facility-administered medications for this visit.    Family History  Problem Relation Age of Onset   Hypertension Mother    Hypertension Father    Heart disease Father    Cancer Brother     Social History   Socioeconomic History   Marital status: Married    Spouse name: Not on file   Number of children: Not on file   Years of education: Not on file   Highest education level: Not on file  Occupational History   Not on file  Tobacco Use    Smoking status: Former    Passive exposure: Never   Smokeless tobacco: Never  Vaping Use   Vaping status: Never Used  Substance and Sexual Activity   Alcohol use: Yes    Comment: MInimal   Drug use: Never   Sexual activity: Not on file  Other Topics Concern   Not on file  Social History Narrative   Not on file   Social Drivers of Health   Financial Resource Strain: Not on file  Food Insecurity: Low Risk  (12/10/2023)   Received from [facility]   Hunger Vital Sign    Within the past 12 months, you worried that your food would run out before you got money to buy more: Never true    Within the past 12 months, the food you bought just didn't last and you didn't have money to get more. : Never true  Transportation Needs: No Transportation Needs (12/10/2023)   Received from [employer]    In the past 12 months, has lack of reliable transportation kept you from medical appointments, meetings, work or from getting things needed for daily living? : No  Physical Activity: Not on file  Stress: Not on file  Social Connections: Not on file  Intimate Partner Violence: Not on file     REVIEW OF SYSTEMS:   [X]  denotes positive finding, [ ]  denotes negative finding Cardiac  Comments:  Chest pain or chest pressure:    Shortness of breath upon exertion:    Short of breath when lying flat:    Irregular heart rhythm:        Vascular    Pain in calf, thigh, or hip brought on by ambulation:    Pain in feet at night that wakes you up from your sleep:     Blood clot in your veins:    Leg swelling:  x       Pulmonary    Oxygen at home:    Productive cough:     Wheezing:         Neurologic    Sudden weakness in arms or legs:     Sudden numbness in arms or legs:     Sudden onset of difficulty speaking or slurred speech:    Temporary loss of vision in one eye:  Problems with dizziness:         Gastrointestinal    Blood in stool:     Vomited blood:          Genitourinary    Burning when urinating:     Blood in urine:        Psychiatric    Major depression:         Hematologic    Bleeding problems:    Problems with blood clotting too easily:        Skin    Rashes or ulcers:        Constitutional    Fever or chills:      PHYSICAL EXAMINATION:  Today's Vitals   02/15/24 1104  BP: (!) 144/85  Pulse: 91  Temp: 97.7 F (36.5 C)  TempSrc: Temporal  Weight: 163 lb (73.9 kg)  PainSc: 0-No pain   Body mass index is 32.92 kg/m.   General:  WDWN in NAD; vital signs documented above Gait: Not observed HENT: WNL, normocephalic Pulmonary: normal non-labored breathing  Cardiac: regular HR, without carotid bruits Abdomen: soft, NT; aortic pulse is not palpable Skin: without rashes Vascular Exam/Pulses:  Right Left  Radial 2+ (normal) 2+ (normal)  DP monophasic biphasic  PT biphasic biphasic   Extremities: without ischemic changes, without Gangrene , without cellulitis; without open wounds; +BLE swelling Musculoskeletal: no muscle wasting or atrophy  Neurologic: A&O X 3;  No focal weakness or paresthesias are detected Psychiatric:  The pt has Normal affect.   Non-Invasive Vascular Imaging:   AAA Arterial duplex on 02/15/2024: +-----------+-------+----------+----------+----------+--------+--------+  Location  AP (cm)Trans (cm)PSV (cm/s)Waveform  ThrombusComments  +-----------+-------+----------+----------+----------+--------+--------+  Proximal  3.20   3.20                                  fusiform  +-----------+-------+----------+----------+----------+--------+--------+  Mid       5.00   5.00                                  fusiform  +-----------+-------+----------+----------+----------+--------+--------+  Distal    5.40   5.40                                  fusiform  +-----------+-------+----------+----------+----------+--------+--------+  RT CIA Prox1.5    1.5       608        monophasic        ectatic   +-----------+-------+----------+----------+----------+--------+--------+  LT CIA Prox1.3    1.3       444       monophasic                  +-----------+-------+----------+----------+----------+--------+--------+   Summary:  Abdominal Aorta: There is evidence of abnormal dilatation of the proximal, mid and distal Abdominal aorta. There is evidence of abnormal dilation of the Right Common Iliac artery. The largest aortic measurement is 5.4 cm.   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  High attenuation material within the stomach may reflect acute blood products. No evidence of active bleeding.  2.  Small volume  free fluid/inflammatory changes adjacent cecum which could reflect nonspecific colitis.  3.  Cirrhotic morphology of the liver.  4.  Abdominal aortic aneurysm measuring up to 5.2 cm with mural thrombus resulting in mild narrowing of the lumen.  5.  High-grade narrowing of the right common iliac artery    Previous AAA arterial duplex on 05/18/2023: Abdominal Aorta Findings:  +-----------+-------+----------+----------+--------+--------+--------+  Location  AP (cm)Trans (cm)PSV (cm/s)WaveformThrombusComments  +-----------+-------+----------+----------+--------+--------+--------+  Proximal  3.38   3.64      53                                  +-----------+-------+----------+----------+--------+--------+--------+  Mid       4.99   4.90      53                                  +-----------+-------+----------+----------+--------+--------+--------+  Distal                                               NWV        +-----------+-------+----------+----------+--------+--------+--------+  RT CIA Prox                                           NWV       +-----------+-------+----------+----------+--------+--------+--------+  LT CIA Prox                                           NWV       +-----------+-------+----------+----------+--------+--------+--------+    ASSESSMENT/PLAN:: 83 y.o. female here for follow up for AAA  -on duplex today, her AAA has increased in size to 5.4cm.  she did have CTA c/a/p in August at [facility].  I am able to see the results, which state her AAA measured 5.2cm but unable to see actual films.   -continue asa/statin -discussed with pt if they develop sudden severe abdominal or back pain, they should call 911 -discussed pt with Dr. Magda and felt that she most likely would need EVAR and will have her return with CT scan and further discussions.  She expressed good understanding.     Lucie Apt, [facility] [phone]  Clinic MD:   Magda

## 2024-03-21 ENCOUNTER — Ambulatory Visit: Attending: Vascular Surgery | Admitting: Vascular Surgery

## 2024-03-21 ENCOUNTER — Encounter: Payer: Self-pay | Admitting: Vascular Surgery

## 2024-03-21 VITALS — BP 129/74 | HR 87 | Temp 98.0°F | Ht 59.0 in | Wt 162.0 lb

## 2024-03-21 DIAGNOSIS — I714 Abdominal aortic aneurysm, without rupture, unspecified: Secondary | ICD-10-CM

## 2024-03-21 NOTE — Progress Notes (Signed)
 [facility] [place]  ASSESSMENT / PLAN: Kathleen Morton is a 83 y.o. female with an infrarenal abdominal aortic aneurysm measuring 48mm. She has cirrhosis with portal hypertension.  She has chronic obstructive pulmonary disease.  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  She is not a candidate for open reconstruction.    Complete cessation from all tobacco products. Excellent blood glucose control with goal A1c < 7%. Blood pressure control with goal blood pressure < 140/90 mmHg. Excellent lipid reduction therapy with goal LDL-C <100 mg/dL. Aspirin 81mg  PO QD.  Atorvastatin 40-80mg  PO QD (or other high intensity statin therapy)  I am trying to obtain her August 2025 images digitally. If these cannot be obtained, then she will need to bring a physical disc to the office for me to review if she is unwilling to repeat the CT angiogram. I will call her Friday.   CHIEF COMPLAINT: AAA surveillance  HISTORY OF PRESENT ILLNESS: Kathleen Morton is a 83 y.o. female deviously seen by Dr. Harvey for surveillance of infrarenal abdominal aortic aneurysm.  The patient is asymptomatic in regards to her aneurysm.  We had a lengthy discussion about the natural history of aneurysm disease and the rationale for treatment.  Explained her low risk of rupture.  03/21/24: Patient returns to clinic for evaluation.  She had a CT scan done in August at [facility] of her abdominal aorta.  She does not want to get another CT scan.  She did not bring her disc with her to this appointment.  I again reviewed the challenges with aortic reconstruction and her, namely that she is not a candidate for open repair with her multiple comorbidities.  Past Medical History:  Diagnosis Date   Anemia    Aneurysm of infrarenal abdominal aorta    Fusiform.  4.6cm   Aortic atherosclerosis    Cirrhosis of liver (HCC)    Coronary artery calcification     Diverticulosis    Descending and Sigmoid   DJD (degenerative joint disease)    Mild facet in lower lumbar spine.   Hyperlipidemia    Hypertension    Mallory-Weiss tear    Osteopenia    Renal cyst    3.2 cm   Rosacea    Thoracic aortic aneurysm 10/30/2018   Distal 3.7cm    Past Surgical History:  Procedure Laterality Date   ABDOMINAL HYSTERECTOMY     TONSILLECTOMY AND ADENOIDECTOMY      Family History  Problem Relation Age of Onset   Hypertension Mother    Hypertension Father    Heart disease Father    Cancer Brother     Social History   Socioeconomic History   Marital status: Married    Spouse name: Not on file   Number of children: Not on file   Years of education: Not on file   Highest education level: Not on file  Occupational History   Not on file  Tobacco Use   Smoking status: Former    Passive exposure: Never   Smokeless tobacco: Never  Vaping Use   Vaping status: Never Used  Substance and Sexual Activity   Alcohol use: Yes    Comment: MInimal   Drug use: Never   Sexual activity: Not on file  Other Topics Concern   Not on file  Social History Narrative   Not on file   Social Drivers of Health   Financial Resource Strain: Not on file  Food Insecurity:  Low Risk (12/10/2023)   Received from [facility]   Hunger Vital Sign    Within the past 12 months, you worried that your food would run out before you got money to buy more: Never true    Within the past 12 months, the food you bought just didn't last and you didn't have money to get more. : Never true  Transportation Needs: No Transportation Needs (12/10/2023)   Received from [employer]    In the past 12 months, has lack of reliable transportation kept you from medical appointments, meetings, work or from getting things needed for daily living? : No  Physical Activity: Not on file  Stress: Not on file  Social Connections: Not on file  Intimate Partner Violence: Not on file     Allergies  Allergen Reactions   Meperidine And Related Nausea And Vomiting   Diphenhydramine    Tetanus Toxoid-Containing Vaccines     And Vaccines    Current Outpatient Medications  Medication Sig Dispense Refill   aspirin 81 MG chewable tablet Chew by mouth daily.     Cholecalciferol (D3 ADULT PO) Take by mouth.     DOXYLAMINE SUCCINATE, SLEEP, PO Take by mouth.     ezetimibe (ZETIA) 10 MG tablet Take 10 mg by mouth.     gabapentin (NEURONTIN) 100 MG capsule Take 100 mg by mouth at bedtime.     losartan (COZAAR) 25 MG tablet Take 25 mg by mouth every evening.     meclizine (ANTIVERT) 25 MG tablet TAKE 1 TABLET EVERY 6 HOURS AS NEEDED FOR DIZZINESS; TAKE 1/2 TO 1 TABLET AS NEEDED FOR VERTIGO SYMPTOMS.     Multiple Vitamin (MULTIVITAMIN) tablet Take 1 tablet by mouth daily.     Multiple Vitamins-Minerals (OCUVITE PO) Take by mouth.     ondansetron  (ZOFRAN ) 4 MG tablet Take 1 tablet (4 mg total) by mouth every 6 (six) hours as needed for nausea or vomiting. 12 tablet 0   pioglitazone (ACTOS) 30 MG tablet Take 15 mg by mouth daily.     pravastatin (PRAVACHOL) 20 MG tablet Take 20 mg by mouth once a week.     traMADol  (ULTRAM ) 50 MG tablet Take 1 tablet (50 mg total) by mouth every 6 (six) hours as needed. 15 tablet 0   No current facility-administered medications for this visit.    PHYSICAL EXAM Vitals:   03/21/24 1430  BP: 129/74  Pulse: 87  Temp: 98 F (36.7 C)  SpO2: 92%  Weight: 162 lb (73.5 kg)  Height: 4' 11 (1.499 m)   Chronically ill elderly woman in no distress Regular rate and rhythm Unlabored breathing  Obese abdomen  PERTINENT LABORATORY AND RADIOLOGIC DATA  ABDOMINAL AORTA STUDY   Patient Name:  Kathleen Morton  Date of Exam:   02/15/2024  Medical Rec #: [id number]           Accession #:    [id number]  Date of Birth: 07-06-1940           Patient Gender: F  Patient Age:   5 years  Exam Location:  [address]  Procedure:      VAS US  AAA DUPLEX   Referring Phys: DEBBY ROBERTSON    ---------------------------------------------------------------------------  -----    Indications: Follow up exam for known AAA. Patient denies any abdominal  pain.   Risk Factors: Hypertension, hyperlipidemia, past history of smoking.   Limitations: Air/bowel gas and obesity.     Comparison Study: On 05/18/2023,  an aorta duplex showed an abnormal  dilatation                   of the proximal and mid abdominal aorta, with largest                    measurements noted in the infrarenal mid segment,  measuring                   5.0 cm.   Performing Technologist: Nanetta Shad RVT     Examination Guidelines: A complete evaluation includes B-mode imaging,  spectral  Doppler, color Doppler, and power Doppler as needed of all accessible  portions  of each vessel. Bilateral testing is considered an integral part of a  complete  examination. Limited examinations for reoccurring indications may be  performed  as noted.     Abdominal Aorta Findings:  +-----------+-------+----------+----------+----------+--------+--------+  Location  AP (cm)Trans (cm)PSV (cm/s)Waveform  ThrombusComments  +-----------+-------+----------+----------+----------+--------+--------+  Proximal  3.20   3.20                                  fusiform  +-----------+-------+----------+----------+----------+--------+--------+  Mid       5.00   5.00                                  fusiform  +-----------+-------+----------+----------+----------+--------+--------+  Distal    5.40   5.40                                  fusiform  +-----------+-------+----------+----------+----------+--------+--------+  RT CIA Prox1.5    1.5       608       monophasic        ectatic   +-----------+-------+----------+----------+----------+--------+--------+  LT CIA Prox1.3    1.3       444       monophasic                   +-----------+-------+----------+----------+----------+--------+--------+         Summary:  Abdominal Aorta: There is evidence of abnormal dilatation of the proximal,  mid and distal Abdominal aorta. There is evidence of abnormal dilation of  the Right Common Iliac artery. The largest aortic measurement is 5.4 cm.   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    Electronically signed by Debby Robertson on 02/15/2024 at 4:46:16 PM.    Debby SAILOR. Robertson, MD [facility] Phone Number: [phone] 03/21/2024 4:47 PM  Total time spent on preparing this encounter including chart review, data review, collecting history, examining the patient, coordinating care for this established patient, 40 minutes.  Portions of this report may have been transcribed using voice recognition software.  Every effort has been made to ensure accuracy; however, inadvertent computerized transcription errors may still be present.

## 2024-03-24 ENCOUNTER — Ambulatory Visit: Attending: Vascular Surgery | Admitting: Vascular Surgery

## 2024-03-24 DIAGNOSIS — I714 Abdominal aortic aneurysm, without rupture, unspecified: Secondary | ICD-10-CM

## 2024-03-24 NOTE — Progress Notes (Signed)
 Discussed with patient. No digital transmission of CT scan. She is not willing to a repeat scan. She will pick up a disc so I can review. I will see her in about a month once I have reviewed her scan.  Kathleen Morton. Magda, MD [facility] Phone Number: [phone] 03/24/2024 3:35 PM

## 2024-05-19 ENCOUNTER — Inpatient Hospital Stay: Admission: RE | Admit: 2024-05-19 | Payer: Self-pay | Source: Ambulatory Visit

## 2024-05-19 ENCOUNTER — Other Ambulatory Visit (HOSPITAL_COMMUNITY): Payer: Self-pay | Admitting: Vascular Surgery

## 2024-05-19 DIAGNOSIS — I714 Abdominal aortic aneurysm, without rupture, unspecified: Secondary | ICD-10-CM

## 2024-05-23 ENCOUNTER — Ambulatory Visit: Admitting: Vascular Surgery
